# Patient Record
Sex: Female | Born: 1990 | Hispanic: Yes | Marital: Single | Smoking: Never smoker
Health system: Southern US, Community
[De-identification: ages and names within clinical notes are randomized; demographics above are authoritative.]

## PROBLEM LIST (undated history)

## (undated) ENCOUNTER — Inpatient Hospital Stay (HOSPITAL_COMMUNITY): Payer: Self-pay

## (undated) DIAGNOSIS — R87629 Unspecified abnormal cytological findings in specimens from vagina: Secondary | ICD-10-CM

## (undated) DIAGNOSIS — IMO0002 Reserved for concepts with insufficient information to code with codable children: Secondary | ICD-10-CM

## (undated) HISTORY — DX: Reserved for concepts with insufficient information to code with codable children: IMO0002

---
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
## 2012-06-13 NOTE — L&D Delivery Note (Signed)
Delivery Note At 11:13 AM a viable female was delivered via Vaginal, Spontaneous Delivery (Presentation: Left Occiput Anterior).  APGAR: 7, 9; weight 6 lb 2.8 oz (2800 g).   Placenta status: Intact, Spontaneous.  Cord: 3 vessels with the following complications: None.    Anesthesia: Epidural  Episiotomy: None Lacerations: None Suture Repair: n/a Est. Blood Loss (mL): 250  Mom to postpartum.  Baby to nursery-stable.  Jacquelin Hawking 03/07/2013, 4:19 PM Evaluation and management procedures were performed by Resident physician under my supervision/collaboration. Chart reviewed, patient examined by me and I agree with management and plan. Peds aware of fetal rt renal agenesis.

## 2012-06-13 NOTE — L&D Delivery Note (Signed)
Attestation of Attending Supervision of Advanced Practitioner (CNM/NP): Evaluation and management procedures were performed by the Advanced Practitioner under my supervision and collaboration.  I have reviewed the Advanced Practitioner's note and chart, and I agree with the management and plan.  HARRAWAY-SMITH, Chosen Geske 11:37 AM

## 2012-10-16 ENCOUNTER — Inpatient Hospital Stay (HOSPITAL_COMMUNITY): Payer: Medicaid Other

## 2012-10-16 ENCOUNTER — Inpatient Hospital Stay (HOSPITAL_COMMUNITY)
Admission: AD | Admit: 2012-10-16 | Discharge: 2012-10-16 | Disposition: A | Discharge status: Home / Self Care | Payer: Medicaid Other | Source: Ambulatory Visit | Attending: Obstetrics & Gynecology | Admitting: Obstetrics & Gynecology

## 2012-10-16 ENCOUNTER — Encounter (HOSPITAL_COMMUNITY): Payer: Self-pay | Admitting: *Deleted

## 2012-10-16 DIAGNOSIS — O0932 Supervision of pregnancy with insufficient antenatal care, second trimester: Secondary | ICD-10-CM

## 2012-10-16 DIAGNOSIS — R109 Unspecified abdominal pain: Secondary | ICD-10-CM | POA: Insufficient documentation

## 2012-10-16 DIAGNOSIS — N760 Acute vaginitis: Secondary | ICD-10-CM | POA: Insufficient documentation

## 2012-10-16 DIAGNOSIS — O2390 Unspecified genitourinary tract infection in pregnancy, unspecified trimester: Secondary | ICD-10-CM | POA: Insufficient documentation

## 2012-10-16 DIAGNOSIS — O4410 Placenta previa with hemorrhage, unspecified trimester: Secondary | ICD-10-CM | POA: Insufficient documentation

## 2012-10-16 DIAGNOSIS — O4690 Antepartum hemorrhage, unspecified, unspecified trimester: Secondary | ICD-10-CM

## 2012-10-16 DIAGNOSIS — O4692 Antepartum hemorrhage, unspecified, second trimester: Secondary | ICD-10-CM

## 2012-10-16 DIAGNOSIS — B9689 Other specified bacterial agents as the cause of diseases classified elsewhere: Secondary | ICD-10-CM | POA: Insufficient documentation

## 2012-10-16 DIAGNOSIS — A499 Bacterial infection, unspecified: Secondary | ICD-10-CM | POA: Insufficient documentation

## 2012-10-16 LAB — CBC
Hemoglobin: 11.8 g/dL — ABNORMAL LOW (ref 12.0–15.0)
MCV: 85.1 fL (ref 78.0–100.0)
Platelets: 185 10*3/uL (ref 150–400)
RBC: 4.03 MIL/uL (ref 3.87–5.11)
WBC: 11.4 10*3/uL — ABNORMAL HIGH (ref 4.0–10.5)

## 2012-10-16 LAB — OB RESULTS CONSOLE ABO/RH: RH Type: POSITIVE

## 2012-10-16 LAB — WET PREP, GENITAL: Yeast Wet Prep HPF POC: NONE SEEN

## 2012-10-16 LAB — DIFFERENTIAL
Lymphocytes Relative: 17 % (ref 12–46)
Lymphs Abs: 1.9 10*3/uL (ref 0.7–4.0)
Monocytes Relative: 10 % (ref 3–12)
Neutrophils Relative %: 74 % (ref 43–77)

## 2012-10-16 LAB — URINALYSIS, ROUTINE W REFLEX MICROSCOPIC
Bilirubin Urine: NEGATIVE
Nitrite: NEGATIVE
Specific Gravity, Urine: 1.015 (ref 1.005–1.030)
Urobilinogen, UA: 0.2 mg/dL (ref 0.0–1.0)
pH: 7 (ref 5.0–8.0)

## 2012-10-16 LAB — OB RESULTS CONSOLE GC/CHLAMYDIA
Chlamydia: NEGATIVE
Gonorrhea: NEGATIVE

## 2012-10-16 LAB — URINE MICROSCOPIC-ADD ON

## 2012-10-16 LAB — RAPID HIV SCREEN (WH-MAU): Rapid HIV Screen: NONREACTIVE

## 2012-10-16 LAB — TYPE AND SCREEN
ABO/RH(D): A POS
Antibody Screen: NEGATIVE

## 2012-10-16 MED ORDER — METRONIDAZOLE 500 MG PO TABS: 500.0000 mg | ORAL_TABLET | Freq: Two times a day (BID) | ORAL | Status: DC

## 2012-10-16 NOTE — MAU Provider Note (Signed)
History     CSN: [id number]  Arrival date and time: 10/16/12 4098   First Provider Initiated Contact with Patient 10/16/12 1953      Chief Complaint  Patient presents with  . Vaginal Bleeding   HPI Pt is G1P0 [redacted]w[redacted]d pregnant by LMP with no pregnantal care- awaiting Medicaid.  Pt presents with some bleeding since 6:30pm noticed when she wipes.  Pt also has some abdominal cramping on and off.  Pt denies UTI sx,constipation or diarrhea.  History reviewed. No pertinent past medical history.  Past Surgical History  Procedure Laterality Date  . Eye surgery      Family History  Problem Relation Age of Onset  . Diabetes Father   . Hyperlipidemia Father   . Diabetes Maternal Grandfather   . Diabetes Paternal Grandmother   . Diabetes Paternal Grandfather     History  Substance Use Topics  . Smoking status: Never Smoker   . Smokeless tobacco: Not on file  . Alcohol Use: No    Allergies: No Known Allergies  Prescriptions prior to admission  Medication Sig Dispense Refill  . Prenatal Vit-Fe Fumarate-FA (PRENATAL MULTIVITAMIN) TABS Take 1 tablet by mouth at bedtime.        Review of Systems  Constitutional: Negative for fever and chills.  Gastrointestinal: Positive for abdominal pain. Negative for nausea, vomiting, diarrhea and constipation.  Genitourinary: Negative for dysuria and urgency.   Physical Exam   Blood pressure 116/61, pulse 81, temperature 98.5 F (36.9 C), temperature source Oral, resp. rate 18, height 5\' 7"  (1.702 m), weight 66.225 kg (146 lb), last menstrual period 06/08/2012.  Physical Exam  Nursing note and vitals reviewed. Constitutional: She is oriented to person, place, and time. She appears well-developed and well-nourished.  HENT:  Head: Normocephalic.  Eyes: Pupils are equal, round, and reactive to light.  Neck: Normal range of motion. Neck supple.  Cardiovascular: Normal rate.   Respiratory: Effort normal.  GI: Soft. Bowel sounds are normal.   Genitourinary:  sm- mod amount of frothy red blood in vault; cervix closed, long, nontender; uterus gravid 16-18 week size. FHT audible with doppler.  Musculoskeletal: Normal range of motion.  Neurological: She is alert and oriented to person, place, and time.  Skin: Skin is warm and dry.  Psychiatric: She has a normal mood and affect.    MAU Course  Procedures Results for orders placed during the hospital encounter of 10/16/12 (from the past 24 hour(s))  URINALYSIS, ROUTINE W REFLEX MICROSCOPIC     Status: Abnormal   Collection Time    10/16/12  7:20 PM      Result Value Range   Color, Urine YELLOW  YELLOW   APPearance CLEAR  CLEAR   Specific Gravity, Urine 1.015  1.005 - 1.030   pH 7.0  5.0 - 8.0   Glucose, UA NEGATIVE  NEGATIVE mg/dL   Hgb urine dipstick MODERATE (*) NEGATIVE   Bilirubin Urine NEGATIVE  NEGATIVE   Ketones, ur NEGATIVE  NEGATIVE mg/dL   Protein, ur NEGATIVE  NEGATIVE mg/dL   Urobilinogen, UA 0.2  0.0 - 1.0 mg/dL   Nitrite NEGATIVE  NEGATIVE   Leukocytes, UA SMALL (*) NEGATIVE  URINE MICROSCOPIC-ADD ON     Status: Abnormal   Collection Time    10/16/12  7:20 PM      Result Value Range   Squamous Epithelial / LPF FEW (*) RARE   WBC, UA 3-6  <3 WBC/hpf   Bacteria, UA FEW (*) RARE  Urine-Other AMORPHOUS URATES/PHOSPHATES    WET PREP, GENITAL     Status: Abnormal   Collection Time    10/16/12  8:00 PM      Result Value Range   Yeast Wet Prep HPF POC NONE SEEN  NONE SEEN   Trich, Wet Prep NONE SEEN  NONE SEEN   Clue Cells Wet Prep HPF POC MODERATE (*) NONE SEEN   WBC, Wet Prep HPF POC MODERATE (*) NONE SEEN   US Ob Limited  10/16/2012  *RADIOLOGY REPORT*  Clinical Data: Bleeding.  Check placental and cervical  LIMITED OBSTETRIC ULTRASOUND  Number of Fetuses: 1 Heart Rate: 142 bpm Movement: Observed  Presentation: Transverse head right Placental Location: Posterior Previa: Marginal 1.2 cm from internal cervical os  Amniotic Fluid (Subjective): Normal   Vertical Pocket 33 cm     (5%ile = 90 cm; 95%ile = 207 cm)  BPD:  3.5 cm      16 w  six d       EDC:  03/27/2013  MATERNAL FINDINGS: Cervix: Closed and  measuring 3.4 cm Uterus/Adnexae:  Normal uterus and ovaries  IMPRESSION:  1. Cervix closed.  2.  Marginal previa 1.2 cm from internal cervical os. 3.  Normal fetal heart rate.  Recommend followup with non-emergent complete OB 14+ wk US examination for fetal biometric evaluation and anatomic survey if not already performed.   Original Report Authenticated By: Genevive Bi, M.D.    US Ob Transvaginal  10/16/2012  *RADIOLOGY REPORT*  Clinical Data: Bleeding.  Check placental and cervical  LIMITED OBSTETRIC ULTRASOUND  Number of Fetuses: 1 Heart Rate: 142 bpm Movement: Observed  Presentation: Transverse head right Placental Location: Posterior Previa: Marginal 1.2 cm from internal cervical os  Amniotic Fluid (Subjective): Normal  Vertical Pocket 33 cm     (5%ile = 90 cm; 95%ile = 207 cm)  BPD:  3.5 cm      16 w  six d       EDC:  03/27/2013  MATERNAL FINDINGS: Cervix: Closed and  measuring 3.4 cm Uterus/Adnexae:  Normal uterus and ovaries  IMPRESSION:  1. Cervix closed.  2.  Marginal previa 1.2 cm from internal cervical os. 3.  Normal fetal heart rate.  Recommend followup with non-emergent complete OB 14+ wk US examination for fetal biometric evaluation and anatomic survey if not already performed.   Original Report Authenticated By: Genevive Bi, M.D.   Discussed with Dr. Macon Large- will send pt home on pelvic rest and have pt seen for OB care in [facility] clinic  Prenatal labs drawn and pending at time of discharge Assessment and Plan  Bleeding in pregnancy- second trimester Marginal placenta previa Bacterial vaginosis- flagyl 500mg  BID for 7 days#14  Aiken Withem 10/16/2012, 9:10 PM

## 2012-10-16 NOTE — MAU Note (Signed)
Patient states she noticed some vaginal bleeding when she used the restroom around 630pm. Abdominal cramping

## 2012-10-17 LAB — URINE CULTURE: Colony Count: NO GROWTH

## 2012-10-17 LAB — HEPATITIS B SURFACE ANTIGEN: Hepatitis B Surface Ag: NEGATIVE

## 2012-10-17 LAB — SICKLE CELL SCREEN: Sickle Cell Screen: NEGATIVE

## 2012-10-17 LAB — RUBELLA SCREEN: Rubella: 5.96 Index — ABNORMAL HIGH (ref <0.90)

## 2012-10-17 LAB — GC/CHLAMYDIA PROBE AMP: GC Probe RNA: NEGATIVE

## 2012-10-17 LAB — ABO/RH: ABO/RH(D): A POS

## 2012-10-19 NOTE — MAU Provider Note (Signed)
Attestation of Attending Supervision of Advanced Practitioner (PA/CNM/NP): Evaluation and management procedures were performed by the Advanced Practitioner under my supervision and collaboration.  I have reviewed the Advanced Practitioner's note and chart, and I agree with the management and plan.  Sera Hitsman, MD, FACOG Attending Obstetrician & Gynecologist Faculty Practice, [facility]   

## 2012-11-13 ENCOUNTER — Inpatient Hospital Stay (HOSPITAL_COMMUNITY)
Admission: AD | Admit: 2012-11-13 | Discharge: 2012-11-13 | Disposition: A | Discharge status: Home / Self Care | Payer: Medicaid Other | Source: Ambulatory Visit | Attending: Obstetrics & Gynecology | Admitting: Obstetrics & Gynecology

## 2012-11-13 ENCOUNTER — Encounter (HOSPITAL_COMMUNITY): Payer: Self-pay

## 2012-11-13 DIAGNOSIS — O4410 Placenta previa with hemorrhage, unspecified trimester: Secondary | ICD-10-CM | POA: Insufficient documentation

## 2012-11-13 DIAGNOSIS — O209 Hemorrhage in early pregnancy, unspecified: Secondary | ICD-10-CM | POA: Insufficient documentation

## 2012-11-13 DIAGNOSIS — O4690 Antepartum hemorrhage, unspecified, unspecified trimester: Secondary | ICD-10-CM

## 2012-11-13 DIAGNOSIS — R109 Unspecified abdominal pain: Secondary | ICD-10-CM | POA: Insufficient documentation

## 2012-11-13 DIAGNOSIS — O4692 Antepartum hemorrhage, unspecified, second trimester: Secondary | ICD-10-CM

## 2012-11-13 LAB — URINALYSIS, ROUTINE W REFLEX MICROSCOPIC
Bilirubin Urine: NEGATIVE
Ketones, ur: NEGATIVE mg/dL
Protein, ur: NEGATIVE mg/dL
Urobilinogen, UA: 0.2 mg/dL (ref 0.0–1.0)

## 2012-11-13 NOTE — MAU Note (Signed)
Pt states she started noticing vaginal bleeding today around 5pm. Pt states she has gone to the bathroom x3 today and seen spotting  When she wiped

## 2012-11-13 NOTE — MAU Provider Note (Signed)
Chief Complaint: Vaginal Bleeding   First Provider Initiated Contact with Patient 11/13/12 1947     SUBJECTIVE HPI: Melanie Moore is a 22 y.o. G1P0 at [redacted]w[redacted]d by 16 wk scan (unsure LMP)  who presents with second episode of vaginal bleeding. Noted pink streak on toilert paper after wiping 2 hrs PTA. Seen here 10/16/12 for VB and had US showing "previa" 1.2 cm from cx. She did pelvic rest until 4 days ago had intercourse since she had no further spotting or bleeding after her initial visit. Had 2 mild cramps but no cramps or abdominal pain now. Good FM. No LOF.   Pregnancy course: [facility] but has appointment at [facility] 10/19/12. Took Flagyl for BV. Denies other pregnancy concerns, except on her feet long hours at work.   A+ blood type. PN labs WNL.  Past Medical History  Diagnosis Date  . Medical history non-contributory    OB History   Grav Para Term Preterm Abortions TAB SAB Ect Mult Living   1              # Outc Date GA Lbr Len/2nd Wgt Sex Del Anes PTL Lv   1 CUR              Past Surgical History  Procedure Laterality Date  . Eye surgery    . Eye surgery     History   Social History  . Marital Status: Single    Spouse Name: N/A    Number of Children: N/A  . Years of Education: N/A   Occupational History  . Not on file.   Social History Main Topics  . Smoking status: Never Smoker   . Smokeless tobacco: Not on file  . Alcohol Use: No  . Drug Use: No  . Sexually Active: Not Currently   Other Topics Concern  . Not on file   Social History Narrative  . No narrative on file   No current facility-administered medications on file prior to encounter.   Current Outpatient Prescriptions on File Prior to Encounter  Medication Sig Dispense Refill  . metroNIDAZOLE (FLAGYL) 500 MG tablet Take 1 tablet (500 mg total) by mouth 2 (two) times daily.  14 tablet  0  . Prenatal Vit-Fe Fumarate-FA (PRENATAL MULTIVITAMIN) TABS Take 1 tablet by mouth at bedtime.       No Known Allergies  ROS:  Pertinent items in HPI  OBJECTIVE Blood pressure 119/66, pulse 74, temperature 98.6 F (37 C), temperature source Oral, resp. rate 16, height 5\' 7"  (1.702 m), weight 67.359 kg (148 lb 8 oz), last menstrual period 06/08/2012. GENERAL: Well-developed, well-nourished female in no acute distress.  HEENT: Normocephalic HEART: normal rate RESP: normal effort ABDOMEN: Soft, non-tender DT FHR 160 EXTREMITIES: Nontender, no edema NEURO: Alert and oriented Gentle cx exam: post closed thick, somewhat soft cx. Scant pinkish-brown discharge.    LAB RESULTS Results for orders placed during the hospital encounter of 11/13/12 (from the past 24 hour(s))  URINALYSIS, ROUTINE W REFLEX MICROSCOPIC     Status: Abnormal   Collection Time    11/13/12  6:55 PM      Result Value Range   Color, Urine YELLOW  YELLOW   APPearance CLEAR  CLEAR   Specific Gravity, Urine 1.020  1.005 - 1.030   pH 7.0  5.0 - 8.0   Glucose, UA NEGATIVE  NEGATIVE mg/dL   Hgb urine dipstick SMALL (*) NEGATIVE   Bilirubin Urine NEGATIVE  NEGATIVE   Ketones, ur NEGATIVE  NEGATIVE mg/dL   Protein, ur NEGATIVE  NEGATIVE mg/dL   Urobilinogen, UA 0.2  0.0 - 1.0 mg/dL   Nitrite NEGATIVE  NEGATIVE   Leukocytes, UA TRACE (*) NEGATIVE  URINE MICROSCOPIC-ADD ON     Status: Abnormal   Collection Time    11/13/12  6:55 PM      Result Value Range   Squamous Epithelial / LPF RARE  RARE   WBC, UA 0-2  <3 WBC/hpf   RBC / HPF 3-6  <3 RBC/hpf   Bacteria, UA FEW (*) RARE    IMAGING US Ob Limited  10/16/2012   *RADIOLOGY REPORT*  Clinical Data: Bleeding.  Check placental and cervical  LIMITED OBSTETRIC ULTRASOUND  Number of Fetuses: 1 Heart Rate: 142 bpm Movement: Observed  Presentation: Transverse head right Placental Location: Posterior Previa: Marginal 1.2 cm from internal cervical os  Amniotic Fluid (Subjective): Normal  Vertical Pocket 33 cm     (5%ile = 90 cm; 95%ile = 207 cm)  BPD:  3.5 cm      16 w  six d       EDC:  03/27/2013  MATERNAL  FINDINGS: Cervix: Closed and  measuring 3.4 cm Uterus/Adnexae:  Normal uterus and ovaries  IMPRESSION:  1. Cervix closed.  2.  Marginal previa 1.2 cm from internal cervical os. 3.  Normal fetal heart rate.  Recommend followup with non-emergent complete OB 14+ wk US examination for fetal biometric evaluation and anatomic survey if not already performed.   Original Report Authenticated By: Genevive Bi, M.D.   US Ob Transvaginal  10/16/2012   *RADIOLOGY REPORT*  Clinical Data: Bleeding.  Check placental and cervical  LIMITED OBSTETRIC ULTRASOUND  Number of Fetuses: 1 Heart Rate: 142 bpm Movement: Observed  Presentation: Transverse head right Placental Location: Posterior Previa: Marginal 1.2 cm from internal cervical os  Amniotic Fluid (Subjective): Normal  Vertical Pocket 33 cm     (5%ile = 90 cm; 95%ile = 207 cm)  BPD:  3.5 cm      16 w  six d       EDC:  03/27/2013  MATERNAL FINDINGS: Cervix: Closed and  measuring 3.4 cm Uterus/Adnexae:  Normal uterus and ovaries  IMPRESSION:  1. Cervix closed.  2.  Marginal previa 1.2 cm from internal cervical os. 3.  Normal fetal heart rate.  Recommend followup with non-emergent complete OB 14+ wk US examination for fetal biometric evaluation and anatomic survey if not already performed.   Original Report Authenticated By: Genevive Bi, M.D.      ASSESSMENT 1. Vaginal bleeding in pregnancy, second trimester   2. Marginal placenta previa, second trimester   G1 at [redacted]w[redacted]d  PLAN Discharge home Follow-up Information   Follow up with [facility]. (Keep yuor appointment for prenatal care on Mon. Someone from ultrasound department will call you with an ultrasound appoointmnet this week. )    Contact information:   [phone]       Medication List    TAKE these medications       metroNIDAZOLE 500 MG tablet  Commonly known as:  FLAGYL  Take 1 tablet (500 mg total) by mouth 2 (two) times daily.     prenatal multivitamin Tabs  Take 1 tablet by mouth  at bedtime.       Follow-up Information   Follow up with [facility]. (Keep yuor appointment for prenatal care on Mon. Someone from ultrasound department will call you with an ultrasound appoointmnet this week. )  Contact information:   [phone]      Danae Orleans, CNM 11/13/2012  8:23 PM

## 2012-11-13 NOTE — MAU Note (Signed)
Pt states here for vaginal bleeding noted when in the restroom. Here prior and dx'd with marginal placenta previa. Last intercourse 4 days ago. Denies abnormal vaginal discharge besides blood. No pain

## 2012-11-15 ENCOUNTER — Other Ambulatory Visit (HOSPITAL_COMMUNITY): Payer: Self-pay

## 2012-11-16 ENCOUNTER — Ambulatory Visit (HOSPITAL_COMMUNITY)
Admission: RE | Admit: 2012-11-16 | Discharge: 2012-11-16 | Disposition: A | Discharge status: Home / Self Care | Payer: Medicaid Other | Source: Ambulatory Visit | Attending: Obstetrics and Gynecology | Admitting: Obstetrics and Gynecology

## 2012-11-16 DIAGNOSIS — Z3689 Encounter for other specified antenatal screening: Secondary | ICD-10-CM | POA: Insufficient documentation

## 2012-11-16 DIAGNOSIS — O4692 Antepartum hemorrhage, unspecified, second trimester: Secondary | ICD-10-CM

## 2012-11-19 ENCOUNTER — Ambulatory Visit (INDEPENDENT_AMBULATORY_CARE_PROVIDER_SITE_OTHER): Payer: Self-pay | Admitting: Obstetrics & Gynecology

## 2012-11-19 ENCOUNTER — Encounter: Payer: Self-pay | Admitting: Obstetrics & Gynecology

## 2012-11-19 VITALS — BP 109/70 | Temp 97.5°F | Wt 145.6 lb

## 2012-11-19 DIAGNOSIS — O0932 Supervision of pregnancy with insufficient antenatal care, second trimester: Secondary | ICD-10-CM

## 2012-11-19 DIAGNOSIS — O358XX1 Maternal care for other (suspected) fetal abnormality and damage, fetus 1: Secondary | ICD-10-CM

## 2012-11-19 DIAGNOSIS — O0930 Supervision of pregnancy with insufficient antenatal care, unspecified trimester: Secondary | ICD-10-CM | POA: Insufficient documentation

## 2012-11-19 DIAGNOSIS — IMO0002 Reserved for concepts with insufficient information to code with codable children: Secondary | ICD-10-CM

## 2012-11-19 DIAGNOSIS — O358XX0 Maternal care for other (suspected) fetal abnormality and damage, not applicable or unspecified: Secondary | ICD-10-CM

## 2012-11-19 HISTORY — DX: Reserved for concepts with insufficient information to code with codable children: IMO0002

## 2012-11-19 LAB — POCT URINALYSIS DIP (DEVICE)
Bilirubin Urine: NEGATIVE
Glucose, UA: NEGATIVE mg/dL
Ketones, ur: NEGATIVE mg/dL
Nitrite: NEGATIVE
Specific Gravity, Urine: 1.02 (ref 1.005–1.030)

## 2012-11-19 NOTE — Progress Notes (Signed)
P=104, Her for initial prenatal visit, has had 2 MAU visits for vaginal bleeding during this pregnancy. Given new pregnancy information and discussed bmi

## 2012-11-19 NOTE — Progress Notes (Signed)
Nutrition Note:  1st visit Wt. Gain slightly greater than expected.  Diet appears adequate.  PNV daily.  NO N/V Plans to BF Certified for [facility] today. Discussed wt. Gain goals of 25-35# F/U if referred. Candice C. Earlene Plater, MPH, RD, LDN

## 2012-11-19 NOTE — Patient Instructions (Addendum)
Return to clinic for any obstetric concerns or go to MAU for evaluation  

## 2012-11-19 NOTE — Progress Notes (Signed)
Harmony Test with Genetic Counseling scheduled 11/1312 at 1 pm in MFM.

## 2012-11-19 NOTE — Progress Notes (Signed)
  Subjective:    Melanie Moore is a G1P0 [redacted]w[redacted]d being seen today for her first obstetrical visit.  Her obstetrical history is significant for fetal right renal agenesis diagnosed on anatomy scan.. Patient does intend to breast feed. Pregnancy history fully reviewed.  Patient reports no complaints.  Filed Vitals:   11/19/12 0940  BP: 109/70  Temp: 97.5 F (36.4 C)  Weight: 145 lb 9.6 oz (66.044 kg)    HISTORY: OB History   Grav Para Term Preterm Abortions TAB SAB Ect Mult Living   1              # Outc Date GA Lbr Len/2nd Wgt Sex Del Anes PTL Lv   1 CUR              Past Medical History  Diagnosis Date  . Medical history non-contributory    Past Surgical History  Procedure Laterality Date  . Eye surgery  2008    sty removed both eyes   Family History  Problem Relation Age of Onset  . Diabetes Father   . Hyperlipidemia Father   . Diabetes Maternal Grandfather   . Diabetes Paternal Grandmother   . Diabetes Paternal Grandfather   . Depression Mother   . Fibromyalgia Mother      Exam    Uterus:  Fundal Height: 21 cm  Pelvic Exam:    Perineum: No Hemorrhoids, Normal Perineum   Vulva: normal   Vagina:  normal mucosa, normal discharge   Cervix: small amount of bleeding following pap smear   Adnexa: normal adnexa and no mass, fullness, tenderness   Bony Pelvis: gynecoid  System: Breast:  normal appearance, no masses or tenderness   Skin: normal coloration and turgor, no rashes   Neurologic: oriented, normal   Extremities: normal strength, tone, and muscle mass   HEENT PERRLA   Mouth/Teeth mucous membranes moist, pharynx normal without lesions   Neck supple and no masses   Cardiovascular: regular rate and rhythm   Respiratory:  appears well, vitals normal, no respiratory distress, acyanotic, normal RR   Abdomen: soft, non-tender; bowel sounds normal; no masses,  no organomegaly   Urinary: urethral meatus normal      Assessment:    Pregnancy: G1P0 Patient  Active Problem List   Diagnosis Date Noted  . Late prenatal care 11/19/2012  . 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 Continue prenatal vitamins. Problem list reviewed and updated. Discussed fetal renal agenesis with Dr. Claudean Severance (MFM); recommends follow up scan at 28 weeks.  Patient interested in [place] testing, referral to MFM made for the test and followup scan. [facility] notified.  Follow up in 4 weeks.    Margaretha Mahan A 11/19/2012

## 2012-11-21 LAB — CULTURE, OB URINE: Colony Count: 15000

## 2012-11-22 ENCOUNTER — Encounter: Payer: Self-pay | Admitting: Obstetrics & Gynecology

## 2012-11-22 ENCOUNTER — Encounter (HOSPITAL_COMMUNITY): Payer: Self-pay

## 2012-11-22 ENCOUNTER — Ambulatory Visit (HOSPITAL_COMMUNITY): Payer: Self-pay

## 2012-11-23 ENCOUNTER — Ambulatory Visit (HOSPITAL_COMMUNITY)
Admission: RE | Admit: 2012-11-23 | Discharge: 2012-11-23 | Disposition: A | Discharge status: Home / Self Care | Payer: Self-pay | Source: Ambulatory Visit | Attending: Obstetrics & Gynecology | Admitting: Obstetrics & Gynecology

## 2012-11-23 ENCOUNTER — Ambulatory Visit (HOSPITAL_COMMUNITY): Admission: RE | Admit: 2012-11-23 | Payer: Self-pay | Source: Ambulatory Visit

## 2012-11-27 NOTE — Progress Notes (Signed)
Genetic Counseling  High-Risk Gestation Note  Appointment Date:  11/23/2012 Referred By: Tereso Newcomer, MD Date of Birth:  1990-07-15    Pregnancy History: G1P0 Estimated Date of Delivery: 03/27/13 Estimated Gestational Age: [redacted]w[redacted]d Attending: Alpha Gula, MD   I met with Ms. Melanie Moore for genetic counseling because of the previous ultrasound finding of unilateral renal agenesis.  We began by reviewing the ultrasound in detail. Ultrasound was performed through Radiology at [facility] [place] on 11/16/12. Right renal agenesis was suspected at that time. Normal amniotic fluid volume was visualized. An ultrasound was not performed at the time of today's visit.   We discussed that renal agenesis can be unilateral or bilateral. Unilateral renal agenesis is estimated to occur in 1 in 1000 births. We discussed that unilateral renal agenesis may be asymptomatic. In cases of unilateral renal agenesis, other renal abnormalities are reported in approximately 50% of cases. Compensatory hypertrophy of the contralateral kidney is reported.   Renal agenesis is typically sporadic. However, there are familial cases reported that are consistent with autosomal dominant inheritance. Ms. Melanie Moore reported no known family history of congenital kidney abnormalities for herself or the father of the pregnancy. Additionally, prenatal exposures such as warfarin, cocaine, and maternal diabetes have been reported to be associated with renal agenesis. Ms. Melanie Moore reported no known exposures during pregnancy that would be expected to increase the chance for birth defects.  We also discussed that renal agenesis is described as an underlying feature of many single gene conditions and can also be seen with chromosome conditions. Specifically, renal agenesis has been described as a feature of two birth defects associations: VACTERL and MURCS, but no additional features suggestive of these associations were  reportedly observed on the patient's previous ultrasound. However, she was counseled that there were no other features reportedly observed by ultrasound that were strongly suggestive of a specific single gene condition or additional features suggestive of a chromosome condition at this time.  We discussed gene, chromosomes, and the chance for nondisjunction associated with age of the ova. We reviewed additional available screening and diagnostic option for chromosome conditions.  Regarding screening tests, we discussed the option of noninvasive prenatal screening (NIPS)/cell free fetal DNA testing.  She understands that screening tests are used to modify a patient's a priori risk for aneuploidy, typically based on age.  This estimate provides a pregnancy specific risk assessment. We reviewed the risks, benefits, and limitations of NIPS and reviewed the conditions screened, detection rates, and false positive rates for each. We also reviewed the availability of diagnostic option of amniocentesis. We discussed that associated 1 in 300-500 risk for complications with amniocentesis, including spontaneous pregnancy loss.  After careful consideration of these options, Ms. Melanie Moore declined NIPS and amniocentesis at this time, given that there were not additional ultrasound findings suggestive of fetal aneuploidy.  She understands that ultrasound cannot rule out all birth defects or genetic syndromes. The patient was advised of this limitation and states she does not want additional screening or testing at this time. Follow-up ultrasound was scheduled for 01/04/13 at the [facility] office.    We discussed that in the case of isolated renal agenesis, recurrence risk is likely low, given that sporadic occurrence is observed in the majority of cases. However, given that autosomal dominant inheritance has been observed in families, renal imaging would be available to Ms. Melanie Moore and the father of  the pregnancy in order to more accurately determine recurrence risk assessment  for relatives. In the case of an underlying autosomal dominant trait, recurrence risk would be 1 in 2 (50%). In the less likely case of an underlying genetic syndrome as the cause, recurrence risk would depend upon the inheritance of the particular condition.  Both family histories were reviewed and found to be contributory for intellectual disability for the patient's female maternal second cousin. The patient reported that an underlying cause for her mental delays is not known. She reportedly does not have physical differences from relatives and is otherwise healthy. No additional relatives were reported with intellectual disability, including this relative's siblings.  Ms. Scholes was counseled that there are many different causes of intellectual disabilities including environmental, multifactorial, and genetic etiologies.  We discussed that a specific diagnosis for intellectual disability can be determined in approximately 50% of these individuals.  In the remaining 50% of individuals, a diagnosis may never be determined.  Regarding genetic causes, we discussed that chromosome aberrations (aneuploidy, deletions, duplications, insertions, and translocations) are responsible for a small percentage of individuals with intellectual disability.  Many individuals with chromosome aberrations have additional differences, including congenital anomalies or minor dysmorphisms.  Likewise, single gene conditions are the underlying cause of intellectual delay in some families.  We discussed that many gene conditions have intellectual disability as a feature, but also often include other physical or medical differences.  We discussed the option of this family member having an evaluation by a medical geneticist to help determine the cause of the familial intellectual disability.  This relative currently resides in the [place].  Additionally, the patient reported that the father of the pregnancy has a paternal half-nephew (his paternal half-sister's son) with some delays with walking and talking at age 31 years. An underlying cause is currently not known, but he reportedly is in the process of being evaluated.  We discussed that without more specific information regarding these relatives, it is difficult to provide an accurate risk assessment.  Further genetic counseling is warranted if more information is obtained.  Ms. Laiya Wisby denied exposure to environmental toxins or chemical agents. She denied the use of alcohol, tobacco or street drugs. She denied significant viral illnesses during the course of her pregnancy. Her medical and surgical histories were noncontributory.   I counseled Ms. Carmaleta Youngers regarding the above risks and available options. The approximate face-to-face time with the genetic counselor was 40 minutes.  Quinn Plowman, MS Certified Genetic Counselor 11/27/2012

## 2012-12-17 ENCOUNTER — Ambulatory Visit (INDEPENDENT_AMBULATORY_CARE_PROVIDER_SITE_OTHER): Payer: Medicaid Other | Admitting: Family Medicine

## 2012-12-17 ENCOUNTER — Encounter: Payer: Self-pay | Admitting: Family Medicine

## 2012-12-17 VITALS — BP 99/63 | Temp 98.6°F | Wt 153.0 lb

## 2012-12-17 DIAGNOSIS — O358XX1 Maternal care for other (suspected) fetal abnormality and damage, fetus 1: Secondary | ICD-10-CM

## 2012-12-17 DIAGNOSIS — O358XX0 Maternal care for other (suspected) fetal abnormality and damage, not applicable or unspecified: Secondary | ICD-10-CM

## 2012-12-17 LAB — POCT URINALYSIS DIP (DEVICE)
Bilirubin Urine: NEGATIVE
Glucose, UA: NEGATIVE mg/dL
Hgb urine dipstick: NEGATIVE
Nitrite: NEGATIVE
Specific Gravity, Urine: 1.025 (ref 1.005–1.030)
Urobilinogen, UA: 0.2 mg/dL (ref 0.0–1.0)

## 2012-12-17 NOTE — Progress Notes (Signed)
U/s for f/u of right renal agenesis in MFM scheduled.

## 2012-12-17 NOTE — Patient Instructions (Addendum)
Pregnancy - Second Trimester The second trimester of pregnancy (3 to 6 months) is a period of rapid growth for you and your baby. At the end of the sixth month, your baby is about 9 inches long and weighs 1 1/2 pounds. You will begin to feel the baby move between 18 and 20 weeks of the pregnancy. This is called quickening. Weight gain is faster. A clear fluid (colostrum) may leak out of your breasts. You may feel small contractions of the womb (uterus). This is known as false labor or Braxton-Hicks contractions. This is like a practice for labor when the baby is ready to be born. Usually, the problems with morning sickness have usually passed by the end of your first trimester. Some women develop small dark blotches (called cholasma, mask of pregnancy) on their face that usually goes away after the baby is born. Exposure to the sun makes the blotches worse. Acne may also develop in some pregnant women and pregnant women who have acne, may find that it goes away. PRENATAL EXAMS  Blood work may continue to be done during prenatal exams. These tests are done to check on your health and the probable health of your baby. Blood work is used to follow your blood levels (hemoglobin). Anemia (low hemoglobin) is common during pregnancy. Iron and vitamins are given to help prevent this. You will also be checked for diabetes between 24 and 28 weeks of the pregnancy. Some of the previous blood tests may be repeated.  The size of the uterus is measured during each visit. This is to make sure that the baby is continuing to grow properly according to the dates of the pregnancy.  Your blood pressure is checked every prenatal visit. This is to make sure you are not getting toxemia.  Your urine is checked to make sure you do not have an infection, diabetes or protein in the urine.  Your weight is checked often to make sure gains are happening at the suggested rate. This is to ensure that both you and your baby are  growing normally.  Sometimes, an ultrasound is performed to confirm the proper growth and development of the baby. This is a test which bounces harmless sound waves off the baby so your caregiver can more accurately determine due dates. Sometimes, a test is done on the amniotic fluid surrounding the baby. This test is called an amniocentesis. The amniotic fluid is obtained by sticking a needle into the belly (abdomen). This is done to check the chromosomes in instances where there is a concern about possible genetic problems with the baby. It is also sometimes done near the end of pregnancy if an early delivery is required. In this case, it is done to help make sure the baby's lungs are mature enough for the baby to live outside of the womb. CHANGES OCCURING IN THE SECOND TRIMESTER OF PREGNANCY Your body goes through many changes during pregnancy. They vary from person to person. Talk to your caregiver about changes you notice that you are concerned about.  During the second trimester, you will likely have an increase in your appetite. It is normal to have cravings for certain foods. This varies from person to person and pregnancy to pregnancy.  Your lower abdomen will begin to bulge.  You may have to urinate more often because the uterus and baby are pressing on your bladder. It is also common to get more bladder infections during pregnancy. You can help this by drinking lots of fluids   and emptying your bladder before and after intercourse.  You may begin to get stretch marks on your hips, abdomen, and breasts. These are normal changes in the body during pregnancy. There are no exercises or medicines to take that prevent this change.  You may begin to develop swollen and bulging veins (varicose veins) in your legs. Wearing support hose, elevating your feet for 15 minutes, 3 to 4 times a day and limiting salt in your diet helps lessen the problem.  Heartburn may develop as the uterus grows and  pushes up against the stomach. Antacids recommended by your caregiver helps with this problem. Also, eating smaller meals 4 to 5 times a day helps.  Constipation can be treated with a stool softener or adding bulk to your diet. Drinking lots of fluids, and eating vegetables, fruits, and whole grains are helpful.  Exercising is also helpful. If you have been very active up until your pregnancy, most of these activities can be continued during your pregnancy. If you have been less active, it is helpful to start an exercise program such as walking.  Hemorrhoids may develop at the end of the second trimester. Warm sitz baths and hemorrhoid cream recommended by your caregiver helps hemorrhoid problems.  Backaches may develop during this time of your pregnancy. Avoid heavy lifting, wear low heal shoes, and practice good posture to help with backache problems.  Some pregnant women develop tingling and numbness of their hand and fingers because of swelling and tightening of ligaments in the wrist (carpel tunnel syndrome). This goes away after the baby is born.  As your breasts enlarge, you may have to get a bigger bra. Get a comfortable, cotton, support bra. Do not get a nursing bra until the last month of the pregnancy if you will be nursing the baby.  You may get a dark line from your belly button to the pubic area called the linea nigra.  You may develop rosy cheeks because of increase blood flow to the face.  You may develop spider looking lines of the face, neck, arms, and chest. These go away after the baby is born. HOME CARE INSTRUCTIONS   It is extremely important to avoid all smoking, herbs, alcohol, and unprescribed drugs during your pregnancy. These chemicals affect the formation and growth of the baby. Avoid these chemicals throughout the pregnancy to ensure the delivery of a healthy infant.  Most of your home care instructions are the same as suggested for the first trimester of your  pregnancy. Keep your caregiver's appointments. Follow your caregiver's instructions regarding medicine use, exercise, and diet.  During pregnancy, you are providing food for you and your baby. Continue to eat regular, well-balanced meals. Choose foods such as meat, fish, milk and other low fat dairy products, vegetables, fruits, and whole-grain breads and cereals. Your caregiver will tell you of the ideal weight gain.  A physical sexual relationship may be continued up until near the end of pregnancy if there are no other problems. Problems could include early (premature) leaking of amniotic fluid from the membranes, vaginal bleeding, abdominal pain, or other medical or pregnancy problems.  Exercise regularly if there are no restrictions. Check with your caregiver if you are unsure of the safety of some of your exercises. The greatest weight gain will occur in the last 2 trimesters of pregnancy. Exercise will help you:  Control your weight.  Get you in shape for labor and delivery.  Lose weight after you have the baby.  Wear   a good support or jogging bra for breast tenderness during pregnancy. This may help if worn during sleep. Pads or tissues may be used in the bra if you are leaking colostrum.  Do not use hot tubs, steam rooms or saunas throughout the pregnancy.  Wear your seat belt at all times when driving. This protects you and your baby if you are in an accident.  Avoid raw meat, uncooked cheese, cat litter boxes, and soil used by cats. These carry germs that can cause birth defects in the baby.  The second trimester is also a good time to visit your dentist for your dental health if this has not been done yet. Getting your teeth cleaned is okay. Use a soft toothbrush. Brush gently during pregnancy.  It is easier to leak urine during pregnancy. Tightening up and strengthening the pelvic muscles will help with this problem. Practice stopping your urination while you are going to the  bathroom. These are the same muscles you need to strengthen. It is also the muscles you would use as if you were trying to stop from passing gas. You can practice tightening these muscles up 10 times a set and repeating this about 3 times per day. Once you know what muscles to tighten up, do not perform these exercises during urination. It is more likely to contribute to an infection by backing up the urine.  Ask for help if you have financial, counseling, or nutritional needs during pregnancy. Your caregiver will be able to offer counseling for these needs as well as refer you for other special needs.  Your skin may become oily. If so, wash your face with mild soap, use non-greasy moisturizer and oil or cream based makeup. MEDICINES AND DRUG USE IN PREGNANCY  Take prenatal vitamins as directed. The vitamin should contain 1 milligram of folic acid. Keep all vitamins out of reach of children. Only a couple vitamins or tablets containing iron may be fatal to a baby or young child when ingested.  Avoid use of all medicines, including herbs, over-the-counter medicines, not prescribed or suggested by your caregiver. Only take over-the-counter or prescription medicines for pain, discomfort, or fever as directed by your caregiver. Do not use aspirin.  Let your caregiver also know about herbs you may be using.  Alcohol is related to a number of birth defects. This includes fetal alcohol syndrome. All alcohol, in any form, should be avoided completely. Smoking will cause low birth rate and premature babies.  Street or illegal drugs are very harmful to the baby. They are absolutely forbidden. A baby born to an addicted mother will be addicted at birth. The baby will go through the same withdrawal an adult does. SEEK MEDICAL CARE IF:  You have any concerns or worries during your pregnancy. It is better to call with your questions if you feel they cannot wait, rather than worry about them. SEEK IMMEDIATE  MEDICAL CARE IF:   An unexplained oral temperature above 102 F (38.9 C) develops, or as your caregiver suggests.  You have leaking of fluid from the vagina (birth canal). If leaking membranes are suspected, take your temperature and tell your caregiver of this when you call.  There is vaginal spotting, bleeding, or passing clots. Tell your caregiver of the amount and how many pads are used. Light spotting in pregnancy is common, especially following intercourse.  You develop a bad smelling vaginal discharge with a change in the color from clear to white.  You continue to feel   sick to your stomach (nauseated) and have no relief from remedies suggested. You vomit blood or coffee ground-like materials.  You lose more than 2 pounds of weight or gain more than 2 pounds of weight over 1 week, or as suggested by your caregiver.  You notice swelling of your face, hands, feet, or legs.  You get exposed to German measles and have never had them.  You are exposed to fifth disease or chickenpox.  You develop belly (abdominal) pain. Round ligament discomfort is a common non-cancerous (benign) cause of abdominal pain in pregnancy. Your caregiver still must evaluate you.  You develop a bad headache that does not go away.  You develop fever, diarrhea, pain with urination, or shortness of breath.  You develop visual problems, blurry, or double vision.  You fall or are in a car accident or any kind of trauma.  There is mental or physical violence at home. Document Released: 05/24/2001 Document Revised: 02/22/2012 Document Reviewed: 11/26/2008 [employer] Patient Information 2014 [employer].  Breastfeeding A change in hormones during your pregnancy causes growth of your breast tissue and an increase in number and size of milk ducts. The hormone prolactin allows proteins, sugars, and fats from your blood supply to make breast milk in your milk-producing glands. The hormone progesterone prevents  breast milk from being released before the birth of your baby. After the birth of your baby, your progesterone level decreases allowing breast milk to be released. Thoughts of your baby, as well as his or her sucking or crying, can stimulate the release of milk from the milk-producing glands. Deciding to breastfeed (nurse) is one of the best choices you can make for you and your baby. The information that follows gives a brief review of the benefits, as well as other important skills to know about breastfeeding. BENEFITS OF BREASTFEEDING For your baby  The first milk (colostrum) helps your baby's digestive system function better.   There are antibodies in your milk that help your baby fight off infections.   Your baby has a lower incidence of asthma, allergies, and sudden infant death syndrome (SIDS).   The nutrients in breast milk are better for your baby than infant formulas.  Breast milk improves your baby's brain development.   Your baby will have less gas, colic, and constipation.  Your baby is less likely to develop other conditions, such as childhood obesity, asthma, or diabetes mellitus. For you  Breastfeeding helps develop a very special bond between you and your baby.   Breastfeeding is convenient, always available at the correct temperature, and costs nothing.   Breastfeeding helps to burn calories and helps you lose the weight gained during pregnancy.   Breastfeeding makes your uterus contract back down to normal size faster and slows bleeding following delivery.   Breastfeeding mothers have a lower risk of developing osteoporosis or breast or ovarian cancer later in life.  BREASTFEEDING FREQUENCY  A healthy, full-term baby may breastfeed as often as every hour or space his or her feedings to every 3 hours. Breastfeeding frequency will vary from baby to baby.   Newborns should be fed no less than every 2 3 hours during the day and every 4 5 hours during the  night. You should breastfeed a minimum of 8 feedings in a 24 hour period.  Awaken your baby to breastfeed if it has been 3 4 hours since the last feeding.  Breastfeed when you feel the need to reduce the fullness of your breasts or when   your newborn shows signs of hunger. Signs that your baby may be hungry include:  Increased alertness or activity.  Stretching.  Movement of the head from side to side.  Movement of the head and opening of the mouth when the corner of the mouth or cheek is stroked (rooting).  Increased sucking sounds, smacking lips, cooing, sighing, or squeaking.  Hand-to-mouth movements.  Increased sucking of fingers or hands.  Fussing.  Intermittent crying.  Signs of extreme hunger will require calming and consoling before you try to feed your baby. Signs of extreme hunger may include:  Restlessness.  A loud, strong cry.  Screaming.  Frequent feeding will help you make more milk and will help prevent problems, such as sore nipples and engorgement of the breasts.  BREASTFEEDING   Whether lying down or sitting, be sure that the baby's abdomen is facing your abdomen.   Support your breast with 4 fingers under your breast and your thumb above your nipple. Make sure your fingers are well away from your nipple and your baby's mouth.   Stroke your baby's lips gently with your finger or nipple.   When your baby's mouth is open wide enough, place all of your nipple and as much of the colored area around your nipple (areola) as possible into your baby's mouth.  More areola should be visible above his or her upper lip than below his or her lower lip.  Your baby's tongue should be between his or her lower gum and your breast.  Ensure that your baby's mouth is correctly positioned around the nipple (latched). Your baby's lips should create a seal on your breast.  Signs that your baby has effectively latched onto your nipple include:  Tugging or sucking  without pain.  Swallowing heard between sucks.  Absent click or smacking sound.  Muscle movement above and in front of his or her ears with sucking.  Your baby must suck about 2 3 minutes in order to get your milk. Allow your baby to feed on each breast as long as he or she wants. Nurse your baby until he or she unlatches or falls asleep at the first breast, then offer the second breast.  Signs that your baby is full and satisfied include:  A gradual decrease in the number of sucks or complete cessation of sucking.  Falling asleep.  Extension or relaxation of his or her body.  Retention of a small amount of milk in his or her mouth.  Letting go of your breast by himself or herself.  Signs of effective breastfeeding in you include:  Breasts that have increased firmness, weight, and size prior to feeding.  Breasts that are softer after nursing.  Increased milk volume, as well as a change in milk consistency and color by the 5th day of breastfeeding.  Breast fullness relieved by breastfeeding.  Nipples are not sore, cracked, or bleeding.  If needed, break the suction by putting your finger into the corner of your baby's mouth and sliding your finger between his or her gums. Then, remove your breast from his or her mouth.  It is common for babies to spit up a small amount after a feeding.  Babies often swallow air during feeding. This can make babies fussy. Burping your baby between breasts can help with this.  Vitamin D supplements are recommended for babies who get only breast milk.  Avoid using a pacifier during your baby's first 4 6 weeks.  Avoid supplemental feedings of water, formula, or   juice in place of breastfeeding. Breast milk is all the food your baby needs. It is not necessary for your baby to have water or formula. Your breasts will make more milk if supplemental feedings are avoided during the early weeks. HOW TO TELL WHETHER YOUR BABY IS GETTING ENOUGH BREAST  MILK Wondering whether or not your baby is getting enough milk is a common concern among mothers. You can be assured that your baby is getting enough milk if:   Your baby is actively sucking and you hear swallowing.   Your baby seems relaxed and satisfied after a feeding.   Your baby nurses at least 8 12 times in a 24 hour time period.  During the first 3 5 days of age:  Your baby is wetting at least 3 5 diapers in a 24 hour period. The urine should be clear and pale yellow.  Your baby is having at least 3 4 stools in a 24 hour period. The stool should be soft and yellow.  At 5 7 days of age, your baby is having at least 3 6 stools in a 24 hour period. The stool should be seedy and yellow by 5 days of age.  Your baby has a weight loss less than 7 10% during the first 3 days of age.  Your baby does not lose weight after 3 7 days of age.  Your baby gains 4 7 ounces each week after he or she is 4 days of age.  Your baby gains weight by 5 days of age and is back to birth weight within 2 weeks. ENGORGEMENT In the first week after your baby is born, you may experience extremely full breasts (engorgement). When engorged, your breasts may feel heavy, warm, or tender to the touch. Engorgement peaks within 24 48 hours after delivery of your baby.  Engorgement may be reduced by:  Continuing to breastfeed.  Increasing the frequency of breastfeeding.  Taking warm showers or applying warm, moist heat to your breasts just before each feeding. This increases circulation and helps the milk flow.   Gently massaging your breast before and during the feedings. With your fingertips, massage from your chest wall towards your nipple in a circular motion.   Ensuring that your baby empties at least one breast at every feeding. It also helps to start the next feeding on the opposite breast.   Expressing breast milk by hand or by using a breast pump to empty the breasts if your baby is sleepy, or  not nursing well. You may also want to express milk if you are returning to work oryou feel you are getting engorged.  Ensuring your baby is latched on and positioned properly while breastfeeding. If you follow these suggestions, your engorgement should improve in 24 48 hours. If you are still experiencing difficulty, call your lactation consultant or caregiver.  CARING FOR YOURSELF Take care of your breasts.  Bathe or shower daily.   Avoid using soap on your nipples.   Wear a supportive bra. Avoid wearing underwire style bras.  Air dry your nipples for a 3 4minutes after each feeding.   Use only cotton bra pads to absorb breast milk leakage. Leaking of breast milk between feedings is normal.   Use only pure lanolin on your nipples after nursing. You do not need to wash it off before feeding your baby again. Another option is to express a few drops of breast milk and gently massage that milk into your nipples.  Continue   breast self-awareness checks. Take care of yourself.  Eat healthy foods. Alternate 3 meals with 3 snacks.  Avoid foods that you notice affect your baby in a bad way.  Drink milk, fruit juice, and water to satisfy your thirst (about 8 glasses a day).   Rest often, relax, and take your prenatal vitamins to prevent fatigue, stress, and anemia.  Avoid chewing and smoking tobacco.  Avoid alcohol and drug use.  Take over-the-counter and prescribed medicine only as directed by your caregiver or pharmacist. You should always check with your caregiver or pharmacist before taking any new medicine, vitamin, or herbal supplement.  Know that pregnancy is possible while breastfeeding. If desired, talk to your caregiver about family planning and safe birth control methods that may be used while breastfeeding. SEEK MEDICAL CARE IF:   You feel like you want to stop breastfeeding or have become frustrated with breastfeeding.  You have painful breasts or nipples.  Your  nipples are cracked or bleeding.  Your breasts are red, tender, or warm.  You have a swollen area on either breast.  You have a fever or chills.  You have nausea or vomiting.  You have drainage from your nipples.  Your breasts do not become full before feedings by the 5th day after delivery.  You feel sad and depressed.  Your baby is too sleepy to eat well.  Your baby is having trouble sleeping.   Your baby is wetting less than 3 diapers in a 24 hour period.  Your baby has less than 3 stools in a 24 hour period.  Your baby's skin or the white part of his or her eyes becomes more yellow.   Your baby is not gaining weight by 5 days of age. MAKE SURE YOU:   Understand these instructions.  Will watch your condition.  Will get help right away if you are not doing well or get worse. Document Released: 05/30/2005 Document Revised: 02/22/2012 Document Reviewed: 01/04/2012 [employer] Patient Information 2014 [employer].  

## 2012-12-17 NOTE — Progress Notes (Signed)
Pulse 94  Edema trace in hands and feet.

## 2012-12-31 ENCOUNTER — Ambulatory Visit (INDEPENDENT_AMBULATORY_CARE_PROVIDER_SITE_OTHER): Payer: Medicaid Other | Admitting: Family Medicine

## 2012-12-31 VITALS — BP 104/67 | Temp 99.0°F | Wt 156.0 lb

## 2012-12-31 DIAGNOSIS — O358XX0 Maternal care for other (suspected) fetal abnormality and damage, not applicable or unspecified: Secondary | ICD-10-CM

## 2012-12-31 DIAGNOSIS — Z3492 Encounter for supervision of normal pregnancy, unspecified, second trimester: Secondary | ICD-10-CM

## 2012-12-31 DIAGNOSIS — O358XX1 Maternal care for other (suspected) fetal abnormality and damage, fetus 1: Secondary | ICD-10-CM

## 2012-12-31 LAB — POCT URINALYSIS DIP (DEVICE)
Protein, ur: NEGATIVE mg/dL
Specific Gravity, Urine: 1.02 (ref 1.005–1.030)
Urobilinogen, UA: 0.2 mg/dL (ref 0.0–1.0)
pH: 7 (ref 5.0–8.0)

## 2012-12-31 LAB — CBC
HCT: 37.8 % (ref 36.0–46.0)
Hemoglobin: 12.8 g/dL (ref 12.0–15.0)
MCH: 29.6 pg (ref 26.0–34.0)
MCHC: 33.9 g/dL (ref 30.0–36.0)
MCV: 87.5 fL (ref 78.0–100.0)

## 2012-12-31 NOTE — Addendum Note (Signed)
Addended by: Franchot Mimes on: 12/31/2012 10:18 AM   Modules accepted: Orders

## 2012-12-31 NOTE — Addendum Note (Signed)
Addended by: Franchot Mimes on: 12/31/2012 11:03 AM   Modules accepted: Orders

## 2012-12-31 NOTE — Patient Instructions (Addendum)
Pregnancy - Second Trimester The second trimester of pregnancy (3 to 6 months) is a period of rapid growth for you and your baby. At the end of the sixth month, your baby is about 9 inches long and weighs 1 1/2 pounds. You will begin to feel the baby move between 18 and 20 weeks of the pregnancy. This is called quickening. Weight gain is faster. A clear fluid (colostrum) may leak out of your breasts. You may feel small contractions of the womb (uterus). This is known as false labor or Braxton-Hicks contractions. This is like a practice for labor when the baby is ready to be born. Usually, the problems with morning sickness have usually passed by the end of your first trimester. Some women develop small dark blotches (called cholasma, mask of pregnancy) on their face that usually goes away after the baby is born. Exposure to the sun makes the blotches worse. Acne may also develop in some pregnant women and pregnant women who have acne, may find that it goes away. PRENATAL EXAMS  Blood work may continue to be done during prenatal exams. These tests are done to check on your health and the probable health of your baby. Blood work is used to follow your blood levels (hemoglobin). Anemia (low hemoglobin) is common during pregnancy. Iron and vitamins are given to help prevent this. You will also be checked for diabetes between 24 and 28 weeks of the pregnancy. Some of the previous blood tests may be repeated.  The size of the uterus is measured during each visit. This is to make sure that the baby is continuing to grow properly according to the dates of the pregnancy.  Your blood pressure is checked every prenatal visit. This is to make sure you are not getting toxemia.  Your urine is checked to make sure you do not have an infection, diabetes or protein in the urine.  Your weight is checked often to make sure gains are happening at the suggested rate. This is to ensure that both you and your baby are  growing normally.  Sometimes, an ultrasound is performed to confirm the proper growth and development of the baby. This is a test which bounces harmless sound waves off the baby so your caregiver can more accurately determine due dates. Sometimes, a test is done on the amniotic fluid surrounding the baby. This test is called an amniocentesis. The amniotic fluid is obtained by sticking a needle into the belly (abdomen). This is done to check the chromosomes in instances where there is a concern about possible genetic problems with the baby. It is also sometimes done near the end of pregnancy if an early delivery is required. In this case, it is done to help make sure the baby's lungs are mature enough for the baby to live outside of the womb. CHANGES OCCURING IN THE SECOND TRIMESTER OF PREGNANCY Your body goes through many changes during pregnancy. They vary from person to person. Talk to your caregiver about changes you notice that you are concerned about.  During the second trimester, you will likely have an increase in your appetite. It is normal to have cravings for certain foods. This varies from person to person and pregnancy to pregnancy.  Your lower abdomen will begin to bulge.  You may have to urinate more often because the uterus and baby are pressing on your bladder. It is also common to get more bladder infections during pregnancy. You can help this by drinking lots of fluids   and emptying your bladder before and after intercourse.  You may begin to get stretch marks on your hips, abdomen, and breasts. These are normal changes in the body during pregnancy. There are no exercises or medicines to take that prevent this change.  You may begin to develop swollen and bulging veins (varicose veins) in your legs. Wearing support hose, elevating your feet for 15 minutes, 3 to 4 times a day and limiting salt in your diet helps lessen the problem.  Heartburn may develop as the uterus grows and  pushes up against the stomach. Antacids recommended by your caregiver helps with this problem. Also, eating smaller meals 4 to 5 times a day helps.  Constipation can be treated with a stool softener or adding bulk to your diet. Drinking lots of fluids, and eating vegetables, fruits, and whole grains are helpful.  Exercising is also helpful. If you have been very active up until your pregnancy, most of these activities can be continued during your pregnancy. If you have been less active, it is helpful to start an exercise program such as walking.  Hemorrhoids may develop at the end of the second trimester. Warm sitz baths and hemorrhoid cream recommended by your caregiver helps hemorrhoid problems.  Backaches may develop during this time of your pregnancy. Avoid heavy lifting, wear low heal shoes, and practice good posture to help with backache problems.  Some pregnant women develop tingling and numbness of their hand and fingers because of swelling and tightening of ligaments in the wrist (carpel tunnel syndrome). This goes away after the baby is born.  As your breasts enlarge, you may have to get a bigger bra. Get a comfortable, cotton, support bra. Do not get a nursing bra until the last month of the pregnancy if you will be nursing the baby.  You may get a dark line from your belly button to the pubic area called the linea nigra.  You may develop rosy cheeks because of increase blood flow to the face.  You may develop spider looking lines of the face, neck, arms, and chest. These go away after the baby is born. HOME CARE INSTRUCTIONS   It is extremely important to avoid all smoking, herbs, alcohol, and unprescribed drugs during your pregnancy. These chemicals affect the formation and growth of the baby. Avoid these chemicals throughout the pregnancy to ensure the delivery of a healthy infant.  Most of your home care instructions are the same as suggested for the first trimester of your  pregnancy. Keep your caregiver's appointments. Follow your caregiver's instructions regarding medicine use, exercise, and diet.  During pregnancy, you are providing food for you and your baby. Continue to eat regular, well-balanced meals. Choose foods such as meat, fish, milk and other low fat dairy products, vegetables, fruits, and whole-grain breads and cereals. Your caregiver will tell you of the ideal weight gain.  A physical sexual relationship may be continued up until near the end of pregnancy if there are no other problems. Problems could include early (premature) leaking of amniotic fluid from the membranes, vaginal bleeding, abdominal pain, or other medical or pregnancy problems.  Exercise regularly if there are no restrictions. Check with your caregiver if you are unsure of the safety of some of your exercises. The greatest weight gain will occur in the last 2 trimesters of pregnancy. Exercise will help you:  Control your weight.  Get you in shape for labor and delivery.  Lose weight after you have the baby.  Wear   a good support or jogging bra for breast tenderness during pregnancy. This may help if worn during sleep. Pads or tissues may be used in the bra if you are leaking colostrum.  Do not use hot tubs, steam rooms or saunas throughout the pregnancy.  Wear your seat belt at all times when driving. This protects you and your baby if you are in an accident.  Avoid raw meat, uncooked cheese, cat litter boxes, and soil used by cats. These carry germs that can cause birth defects in the baby.  The second trimester is also a good time to visit your dentist for your dental health if this has not been done yet. Getting your teeth cleaned is okay. Use a soft toothbrush. Brush gently during pregnancy.  It is easier to leak urine during pregnancy. Tightening up and strengthening the pelvic muscles will help with this problem. Practice stopping your urination while you are going to the  bathroom. These are the same muscles you need to strengthen. It is also the muscles you would use as if you were trying to stop from passing gas. You can practice tightening these muscles up 10 times a set and repeating this about 3 times per day. Once you know what muscles to tighten up, do not perform these exercises during urination. It is more likely to contribute to an infection by backing up the urine.  Ask for help if you have financial, counseling, or nutritional needs during pregnancy. Your caregiver will be able to offer counseling for these needs as well as refer you for other special needs.  Your skin may become oily. If so, wash your face with mild soap, use non-greasy moisturizer and oil or cream based makeup. MEDICINES AND DRUG USE IN PREGNANCY  Take prenatal vitamins as directed. The vitamin should contain 1 milligram of folic acid. Keep all vitamins out of reach of children. Only a couple vitamins or tablets containing iron may be fatal to a baby or young child when ingested.  Avoid use of all medicines, including herbs, over-the-counter medicines, not prescribed or suggested by your caregiver. Only take over-the-counter or prescription medicines for pain, discomfort, or fever as directed by your caregiver. Do not use aspirin.  Let your caregiver also know about herbs you may be using.  Alcohol is related to a number of birth defects. This includes fetal alcohol syndrome. All alcohol, in any form, should be avoided completely. Smoking will cause low birth rate and premature babies.  Street or illegal drugs are very harmful to the baby. They are absolutely forbidden. A baby born to an addicted mother will be addicted at birth. The baby will go through the same withdrawal an adult does. SEEK MEDICAL CARE IF:  You have any concerns or worries during your pregnancy. It is better to call with your questions if you feel they cannot wait, rather than worry about them. SEEK IMMEDIATE  MEDICAL CARE IF:   An unexplained oral temperature above 102 F (38.9 C) develops, or as your caregiver suggests.  You have leaking of fluid from the vagina (birth canal). If leaking membranes are suspected, take your temperature and tell your caregiver of this when you call.  There is vaginal spotting, bleeding, or passing clots. Tell your caregiver of the amount and how many pads are used. Light spotting in pregnancy is common, especially following intercourse.  You develop a bad smelling vaginal discharge with a change in the color from clear to white.  You continue to feel   sick to your stomach (nauseated) and have no relief from remedies suggested. You vomit blood or coffee ground-like materials.  You lose more than 2 pounds of weight or gain more than 2 pounds of weight over 1 week, or as suggested by your caregiver.  You notice swelling of your face, hands, feet, or legs.  You get exposed to German measles and have never had them.  You are exposed to fifth disease or chickenpox.  You develop belly (abdominal) pain. Round ligament discomfort is a common non-cancerous (benign) cause of abdominal pain in pregnancy. Your caregiver still must evaluate you.  You develop a bad headache that does not go away.  You develop fever, diarrhea, pain with urination, or shortness of breath.  You develop visual problems, blurry, or double vision.  You fall or are in a car accident or any kind of trauma.  There is mental or physical violence at home. Document Released: 05/24/2001 Document Revised: 02/22/2012 Document Reviewed: 11/26/2008 [employer] Patient Information 2014 [employer].  

## 2012-12-31 NOTE — Progress Notes (Signed)
+  FM no lof, no VB, No Ctx No complaints today Pending 1hr glucose Reviewed MFM consult

## 2012-12-31 NOTE — Progress Notes (Signed)
Pulse: 91

## 2013-01-01 LAB — RPR

## 2013-01-02 ENCOUNTER — Other Ambulatory Visit: Payer: Self-pay | Admitting: Obstetrics & Gynecology

## 2013-01-02 DIAGNOSIS — R9389 Abnormal findings on diagnostic imaging of other specified body structures: Secondary | ICD-10-CM

## 2013-01-02 LAB — CULTURE, OB URINE: Colony Count: NO GROWTH

## 2013-01-04 ENCOUNTER — Ambulatory Visit (HOSPITAL_COMMUNITY)
Admission: RE | Admit: 2013-01-04 | Discharge: 2013-01-04 | Disposition: A | Discharge status: Home / Self Care | Payer: Medicaid Other | Source: Ambulatory Visit | Attending: Obstetrics and Gynecology | Admitting: Obstetrics and Gynecology

## 2013-01-04 VITALS — BP 126/64 | HR 87 | Wt 159.5 lb

## 2013-01-04 DIAGNOSIS — R9389 Abnormal findings on diagnostic imaging of other specified body structures: Secondary | ICD-10-CM

## 2013-01-04 DIAGNOSIS — Z3689 Encounter for other specified antenatal screening: Secondary | ICD-10-CM | POA: Insufficient documentation

## 2013-01-04 DIAGNOSIS — O358XX0 Maternal care for other (suspected) fetal abnormality and damage, not applicable or unspecified: Secondary | ICD-10-CM | POA: Insufficient documentation

## 2013-01-04 DIAGNOSIS — O358XX1 Maternal care for other (suspected) fetal abnormality and damage, fetus 1: Secondary | ICD-10-CM

## 2013-01-04 NOTE — Progress Notes (Signed)
Melanie Moore  was seen today for an ultrasound appointment.  See full report in [facility].  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  The right renal artery was not visualized on color flow Doppler Interval growth is appropriate (38th %tile) Normal amniotic fluid volume  Recommendations: Recommend follow-up ultrasound examination in 4 weeks. Will need ultrasound of the newborn after delivery to confirm prenatal ultrasound findings.  Alpha Gula, MD

## 2013-01-05 ENCOUNTER — Encounter: Payer: Self-pay | Admitting: Obstetrics & Gynecology

## 2013-01-21 ENCOUNTER — Ambulatory Visit (INDEPENDENT_AMBULATORY_CARE_PROVIDER_SITE_OTHER): Payer: Medicaid Other | Admitting: Obstetrics and Gynecology

## 2013-01-21 VITALS — BP 95/62 | Wt 161.7 lb

## 2013-01-21 DIAGNOSIS — O4693 Antepartum hemorrhage, unspecified, third trimester: Secondary | ICD-10-CM

## 2013-01-21 DIAGNOSIS — O359XX0 Maternal care for (suspected) fetal abnormality and damage, unspecified, not applicable or unspecified: Secondary | ICD-10-CM

## 2013-01-21 DIAGNOSIS — O4690 Antepartum hemorrhage, unspecified, unspecified trimester: Secondary | ICD-10-CM

## 2013-01-21 DIAGNOSIS — O359XX4 Maternal care for (suspected) fetal abnormality and damage, unspecified, fetus 4: Secondary | ICD-10-CM

## 2013-01-21 LAB — POCT URINALYSIS DIP (DEVICE)
Protein, ur: NEGATIVE mg/dL
Specific Gravity, Urine: 1.02 (ref 1.005–1.030)
Urobilinogen, UA: 0.2 mg/dL (ref 0.0–1.0)
pH: 6.5 (ref 5.0–8.0)

## 2013-01-21 NOTE — Progress Notes (Signed)
Pulse- 81 Patient reports spotting after intercourse

## 2013-01-21 NOTE — Progress Notes (Signed)
VB: Denies spotting today. No irritative vaginal d/c, UCs or LOF. Good FM.  Has MFM [place] scheduled 8/22. Also will get PP [place] d/t fetal left renal agenesis 1 hr glucola 135>advised no added simple sugars to diet. Undecided contraception>reviewed LARC

## 2013-01-21 NOTE — Patient Instructions (Signed)
Contraception Choices  Contraception (birth control) is the use of any methods or devices to prevent pregnancy. Below are some methods to help avoid pregnancy.  HORMONAL METHODS   · Contraceptive implant. This is a thin, plastic tube containing progesterone hormone. It does not contain estrogen hormone. Your caregiver inserts the tube in the inner part of the upper arm. The tube can remain in place for up to 3 years. After 3 years, the implant must be removed. The implant prevents the ovaries from releasing an egg (ovulation), thickens the cervical mucus which prevents sperm from entering the uterus, and thins the lining of the inside of the uterus.  · Progesterone-only injections. These injections are given every 3 months by your caregiver to prevent pregnancy. This synthetic progesterone hormone stops the ovaries from releasing eggs. It also thickens cervical mucus and changes the uterine lining. This makes it harder for sperm to survive in the uterus.  · Birth control pills. These pills contain estrogen and progesterone hormone. They work by stopping the egg from forming in the ovary (ovulation). Birth control pills are prescribed by a caregiver. Birth control pills can also be used to treat heavy periods.  · Minipill. This type of birth control pill contains only the progesterone hormone. They are taken every day of each month and must be prescribed by your caregiver.  · Birth control patch. The patch contains hormones similar to those in birth control pills. It must be changed once a week and is prescribed by a caregiver.  · Vaginal ring. The ring contains hormones similar to those in birth control pills. It is left in the vagina for 3 weeks, removed for 1 week, and then a new one is put back in place. The patient must be comfortable inserting and removing the ring from the vagina. A caregiver's prescription is necessary.  · Emergency contraception. Emergency contraceptives prevent pregnancy after unprotected  sexual intercourse. This pill can be taken right after sex or up to 5 days after unprotected sex. It is most effective the sooner you take the pills after having sexual intercourse. Emergency contraceptive pills are available without a prescription. Check with your pharmacist. Do not use emergency contraception as your only form of birth control.  BARRIER METHODS   · Female condom. This is a thin sheath (latex or rubber) that is worn over the penis during sexual intercourse. It can be used with spermicide to increase effectiveness.  · Female condom. This is a soft, loose-fitting sheath that is put into the vagina before sexual intercourse.  · Diaphragm. This is a soft, latex, dome-shaped barrier that must be fitted by a caregiver. It is inserted into the vagina, along with a spermicidal jelly. It is inserted before intercourse. The diaphragm should be left in the vagina for 6 to 8 hours after intercourse.  · Cervical cap. This is a round, soft, latex or plastic cup that fits over the cervix and must be fitted by a caregiver. The cap can be left in place for up to 48 hours after intercourse.  · Sponge. This is a soft, circular piece of polyurethane foam. The sponge has spermicide in it. It is inserted into the vagina after wetting it and before sexual intercourse.  · Spermicides. These are chemicals that kill or block sperm from entering the cervix and uterus. They come in the form of creams, jellies, suppositories, foam, or tablets. They do not require a prescription. They are inserted into the vagina with an applicator before having sexual intercourse.   The process must be repeated every time you have sexual intercourse.  INTRAUTERINE CONTRACEPTION  · Intrauterine device (IUD). This is a T-shaped device that is put in a woman's uterus during a menstrual period to prevent pregnancy. There are 2 types:  · Copper IUD. This type of IUD is wrapped in copper wire and is placed inside the uterus. Copper makes the uterus and  fallopian tubes produce a fluid that kills sperm. It can stay in place for 10 years.  · Hormone IUD. This type of IUD contains the hormone progestin (synthetic progesterone). The hormone thickens the cervical mucus and prevents sperm from entering the uterus, and it also thins the uterine lining to prevent implantation of a fertilized egg. The hormone can weaken or kill the sperm that get into the uterus. It can stay in place for 5 years.  PERMANENT METHODS OF CONTRACEPTION  · Female tubal ligation. This is when the woman's fallopian tubes are surgically sealed, tied, or blocked to prevent the egg from traveling to the uterus.  · Female sterilization. This is when the female has the tubes that carry sperm tied off (vasectomy). This blocks sperm from entering the vagina during sexual intercourse. After the procedure, the man can still ejaculate fluid (semen).  NATURAL PLANNING METHODS  · Natural family planning. This is not having sexual intercourse or using a barrier method (condom, diaphragm, cervical cap) on days the woman could become pregnant.  · Calendar method. This is keeping track of the length of each menstrual cycle and identifying when you are fertile.  · Ovulation method. This is avoiding sexual intercourse during ovulation.  · Symptothermal method. This is avoiding sexual intercourse during ovulation, using a thermometer and ovulation symptoms.  · Post-ovulation method. This is timing sexual intercourse after you have ovulated.  Regardless of which type or method of contraception you choose, it is important that you use condoms to protect against the transmission of sexually transmitted diseases (STDs). Talk with your caregiver about which form of contraception is most appropriate for you.  Document Released: 05/30/2005 Document Revised: 08/22/2011 Document Reviewed: 10/06/2010  [employer]® Patient Information ©2014 [employer].

## 2013-02-01 ENCOUNTER — Ambulatory Visit (HOSPITAL_COMMUNITY)
Admission: RE | Admit: 2013-02-01 | Discharge: 2013-02-01 | Disposition: A | Discharge status: Home / Self Care | Payer: Medicaid Other | Source: Ambulatory Visit | Attending: Obstetrics & Gynecology | Admitting: Obstetrics & Gynecology

## 2013-02-01 ENCOUNTER — Encounter (HOSPITAL_COMMUNITY): Payer: Self-pay

## 2013-02-01 VITALS — BP 107/66 | HR 98 | Wt 166.0 lb

## 2013-02-01 DIAGNOSIS — O358XX0 Maternal care for other (suspected) fetal abnormality and damage, not applicable or unspecified: Secondary | ICD-10-CM | POA: Insufficient documentation

## 2013-02-01 DIAGNOSIS — O358XX1 Maternal care for other (suspected) fetal abnormality and damage, fetus 1: Secondary | ICD-10-CM

## 2013-02-01 DIAGNOSIS — Z3689 Encounter for other specified antenatal screening: Secondary | ICD-10-CM | POA: Insufficient documentation

## 2013-02-01 NOTE — Progress Notes (Signed)
Melanie Moore  was seen today for an ultrasound appointment.  See full report in [facility].  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  The right renal artery was not visualized on color flow Doppler Interval growth is appropriate (33rd %tile) Normal amniotic fluid volume  Recommendations: Recommend follow-up ultrasound examination in 4 weeks.  Alpha Gula, MD

## 2013-02-04 ENCOUNTER — Encounter: Payer: Self-pay | Admitting: Family Medicine

## 2013-02-04 ENCOUNTER — Ambulatory Visit (INDEPENDENT_AMBULATORY_CARE_PROVIDER_SITE_OTHER): Payer: Medicaid Other | Admitting: Obstetrics & Gynecology

## 2013-02-04 VITALS — BP 104/66 | Temp 97.6°F | Wt 165.3 lb

## 2013-02-04 DIAGNOSIS — O0933 Supervision of pregnancy with insufficient antenatal care, third trimester: Secondary | ICD-10-CM

## 2013-02-04 DIAGNOSIS — O0930 Supervision of pregnancy with insufficient antenatal care, unspecified trimester: Secondary | ICD-10-CM

## 2013-02-04 LAB — POCT URINALYSIS DIP (DEVICE)
Glucose, UA: NEGATIVE mg/dL
Nitrite: NEGATIVE
Protein, ur: NEGATIVE mg/dL
Urobilinogen, UA: 0.2 mg/dL (ref 0.0–1.0)

## 2013-02-04 NOTE — Progress Notes (Signed)
Still some spotting as before. No UC, LOF, itching. Low back pain, mild constiption, try Metamucil

## 2013-02-04 NOTE — Progress Notes (Signed)
Pulse- 87 Patient reports spotting

## 2013-02-04 NOTE — Patient Instructions (Addendum)
Back Pain in Pregnancy Back pain during pregnancy is common. It happens in about half of all pregnancies. It is important for you and your baby that you remain active during your pregnancy.If you feel that back pain is not allowing you to remain active or sleep well, it is time to see your caregiver. Back pain may be caused by several factors related to changes during your pregnancy.Fortunately, unless you had trouble with your back before your pregnancy, the pain is likely to get better after you deliver. Low back pain usually occurs between the fifth and seventh months of pregnancy. It can, however, happen in the first couple months. Factors that increase the risk of back problems include:   Previous back problems.  Injury to your back.  Having twins or multiple births.  A chronic cough.  Stress.  Job-related repetitive motions.  Muscle or spinal disease in the back.  Family history of back problems, ruptured (herniated) discs, or osteoporosis.  Depression, anxiety, and panic attacks. CAUSES   When you are pregnant, your body produces a hormone called relaxin. This hormonemakes the ligaments connecting the low back and pubic bones more flexible. This flexibility allows the baby to be delivered more easily. When your ligaments are loose, your muscles need to work harder to support your back. Soreness in your back can come from tired muscles. Soreness can also come from back tissues that are irritated since they are receiving less support.  As the baby grows, it puts pressure on the nerves and blood vessels in your pelvis. This can cause back pain.  As the baby grows and gets heavier during pregnancy, the uterus pushes the stomach muscles forward and changes your center of gravity. This makes your back muscles work harder to maintain good posture. SYMPTOMS  Lumbar pain during pregnancy Lumbar pain during pregnancy usually occurs at or above the waist in the center of the back. There  may be pain and numbness that radiates into your leg or foot. This is similar to low back pain experienced by non-pregnant women. It usually increases with sitting for long periods of time, standing, or repetitive lifting. Tenderness may also be present in the muscles along your upper back. Posterior pelvic pain during pregnancy Pain in the back of the pelvis is more common than lumbar pain in pregnancy. It is a deep pain felt in your side at the waistline, or across the tailbone (sacrum), or in both places. You may have pain on one or both sides. This pain can also go into the buttocks and backs of the upper thighs. Pubic and groin pain may also be present. The pain does not quickly resolve with rest, and morning stiffness may also be present. Pelvic pain during pregnancy can be brought on by most activities. A high level of fitness before and during pregnancy may or may not prevent this problem. Labor pain is usually 1 to 2 minutes apart, lasts for about 1 minute, and involves a bearing down feeling or pressure in your pelvis. However, if you are at term with the pregnancy, constant low back pain can be the beginning of early labor, and you should be aware of this. DIAGNOSIS  X-rays of the back should not be done during the first 12 to 14 weeks of the pregnancy and only when absolutely necessary during the rest of the pregnancy. MRIs do not give off radiation and are safe during pregnancy. MRIs also should only be done when absolutely necessary. HOME CARE INSTRUCTIONS  Exercise   as directed by your caregiver. Exercise is the most effective way to prevent or manage back pain. If you have a back problem, it is especially important to avoid sports that require sudden body movements. Swimming and walking are great activities.  Do not stand in one place for long periods of time.  Do not wear high heels.  Sit in chairs with good posture. Use a pillow on your lower back if necessary. Make sure your head  rests over your shoulders and is not hanging forward.  Try sleeping on your side, preferably the left side, with a pillow or two between your legs. If you are sore after a night's rest, your bedmay betoo soft.Try placing a board between your mattress and box spring.  Listen to your body when lifting.If you are experiencing pain, ask for help or try bending yourknees more so you can use your leg muscles rather than your back muscles. Squat down when picking up something from the floor. Do not bend over.  Eat a healthy diet. Try to gain weight within your caregiver's recommendations.  Use heat or cold packs 3 to 4 times a day for 15 minutes to help with the pain.  Only take over-the-counter or prescription medicines for pain, discomfort, or fever as directed by your caregiver. Sudden (acute) back pain  Use bed rest for only the most extreme, acute episodes of back pain. Prolonged bed rest over 48 hours will aggravate your condition.  Ice is very effective for acute conditions.  Put ice in a plastic bag.  Place a towel between your skin and the bag.  Leave the ice on for 10 to 20 minutes every 2 hours, or as needed.  Using heat packs for 30 minutes prior to activities is also helpful. Continued back pain See your caregiver if you have continued problems. Your caregiver can help or refer you for appropriate physical therapy. With conditioning, most back problems can be avoided. Sometimes, a more serious issue may be the cause of back pain. You should be seen right away if new problems seem to be developing. Your caregiver may recommend:  A maternity girdle.  An elastic sling.  A back brace.  A massage therapist or acupuncture. SEEK MEDICAL CARE IF:   You are not able to do most of your daily activities, even when taking the pain medicine you were given.  You need a referral to a physical therapist or chiropractor.  You want to try acupuncture. SEEK IMMEDIATE MEDICAL CARE  IF:  You develop numbness, tingling, weakness, or problems with the use of your arms or legs.  You develop severe back pain that is no longer relieved with medicines.  You have a sudden change in bowel or bladder control.  You have increasing pain in other areas of the body.  You develop shortness of breath, dizziness, or fainting.  You develop nausea, vomiting, or sweating.  You have back pain which is similar to labor pains.  You have back pain along with your water breaking or vaginal bleeding.  You have back pain or numbness that travels down your leg.  Your back pain developed after you fell.  You develop pain on one side of your back. You may have a kidney stone.  You see blood in your urine. You may have a bladder infection or kidney stone.  You have back pain with blisters. You may have shingles. Back pain is fairly common during pregnancy but should not be accepted as just part of   the process. Back pain should always be treated as soon as possible. This will make your pregnancy as pleasant as possible. Document Released: 09/07/2005 Document Revised: 08/22/2011 Document Reviewed: 10/19/2010 [facility] Patient Information 2014 [place], [place]. Constipation, Adult Constipation is when a person has fewer than 3 bowel movements a week; has difficulty having a bowel movement; or has stools that are dry, hard, or larger than normal. As people grow older, constipation is more common. If you try to fix constipation with medicines that make you have a bowel movement (laxatives), the problem may get worse. Long-term laxative use may cause the muscles of the colon to become weak. A low-fiber diet, not taking in enough fluids, and taking certain medicines may make constipation worse. CAUSES  Certain medicines, such as antidepressants, pain medicine, iron supplements, antacids, and water pills.  Certain diseases, such as diabetes, irritable bowel syndrome (IBS), thyroid disease, or  depression.  Not drinking enough water.  Not eating enough fiber-rich foods.  Stress or travel. Lack of physical activity or exercise. Not going to the restroom when there is the urge to have a bowel movement. Ignoring the urge to have a bowel movement. Using laxatives too much. SYMPTOMS  Having fewer than 3 bowel movements a week.  Straining to have a bowel movement.  Having hard, dry, or larger than normal stools.  Feeling full or bloated.  Pain in the lower abdomen. Not feeling relief after having a bowel movement. DIAGNOSIS  Your caregiver will take a medical history and perform a physical exam. Further testing may be done for severe constipation. Some tests may include:  A barium enema X-ray to examine your rectum, colon, and sometimes, your small intestine. A sigmoidoscopy to examine your lower colon. A colonoscopy to examine your entire colon. TREATMENT  Treatment will depend on the severity of your constipation and what is causing it. Some dietary treatments include drinking more fluids and eating more fiber-rich foods. Lifestyle treatments may include regular exercise. If these diet and lifestyle recommendations do not help, your caregiver may recommend taking over-the-counter laxative medicines to help you have bowel movements. Prescription medicines may be prescribed if over-the-counter medicines do not work.  HOME CARE INSTRUCTIONS  Increase dietary fiber in your diet, such as fruits, vegetables, whole grains, and beans. Limit high-fat and processed sugars in your diet, such as [place] fries, hamburgers, cookies, candies, and soda.  A fiber supplement may be added to your diet if you cannot get enough fiber from foods.  Drink enough fluids to keep your urine clear or pale yellow.  Exercise regularly or as directed by your caregiver.  Go to the restroom when you have the urge to go. Do not hold it. Only take medicines as directed by your caregiver. Do not take other  medicines for constipation without talking to your caregiver first. SEEK IMMEDIATE MEDICAL CARE IF:  You have bright red blood in your stool.  Your constipation lasts for more than 4 days or gets worse.  You have abdominal or rectal pain.  You have thin, pencil-like stools. You have unexplained weight loss. MAKE SURE YOU:  Understand these instructions. Will watch your condition. Will get help right away if you are not doing well or get worse. Document Released: 02/26/2004 Document Revised: 08/22/2011 Document Reviewed: 05/03/2011 [facility] Patient Information 2014 [place], [place].

## 2013-02-18 ENCOUNTER — Ambulatory Visit (INDEPENDENT_AMBULATORY_CARE_PROVIDER_SITE_OTHER): Payer: Medicaid Other | Admitting: Obstetrics and Gynecology

## 2013-02-18 VITALS — BP 105/71 | Temp 98.6°F | Wt 167.2 lb

## 2013-02-18 DIAGNOSIS — O0930 Supervision of pregnancy with insufficient antenatal care, unspecified trimester: Secondary | ICD-10-CM

## 2013-02-18 LAB — POCT URINALYSIS DIP (DEVICE)
Bilirubin Urine: NEGATIVE
Nitrite: NEGATIVE
Protein, ur: NEGATIVE mg/dL
Urobilinogen, UA: 0.2 mg/dL (ref 0.0–1.0)
pH: 7 (ref 5.0–8.0)

## 2013-02-18 NOTE — Patient Instructions (Signed)
Fetal Movement Counts Patient Name: __________________________________________________ Patient Due Date: ____________________ Performing a fetal movement count is highly recommended in high-risk pregnancies, but it is good for every pregnant woman to do. Your caregiver may ask you to start counting fetal movements at 28 weeks of the pregnancy. Fetal movements often increase:  After eating a full meal.  After physical activity.  After eating or drinking something sweet or cold.  At rest. Pay attention to when you feel the baby is most active. This will help you notice a pattern of your baby's sleep and wake cycles and what factors contribute to an increase in fetal movement. It is important to perform a fetal movement count at the same time each day when your baby is normally most active.  HOW TO COUNT FETAL MOVEMENTS 1. Find a quiet and comfortable area to sit or lie down on your left side. Lying on your left side provides the best blood and oxygen circulation to your baby. 2. Write down the day and time on a sheet of paper or in a journal. 3. Start counting kicks, flutters, swishes, rolls, or jabs in a 2 hour period. You should feel at least 10 movements within 2 hours. 4. If you do not feel 10 movements in 2 hours, wait 2 3 hours and count again. Look for a change in the pattern or not enough counts in 2 hours. SEEK MEDICAL CARE IF:  You feel less than 10 counts in 2 hours, tried twice.  There is no movement in over an hour.  The pattern is changing or taking longer each day to reach 10 counts in 2 hours.  You feel the baby is not moving as he or she usually does. Date: ____________ Movements: ____________ Start time: ____________ Finish time: ____________  Date: ____________ Movements: ____________ Start time: ____________ Finish time: ____________ Date: ____________ Movements: ____________ Start time: ____________ Finish time: ____________ Date: ____________ Movements: ____________  Start time: ____________ Finish time: ____________ Date: ____________ Movements: ____________ Start time: ____________ Finish time: ____________ Date: ____________ Movements: ____________ Start time: ____________ Finish time: ____________ Date: ____________ Movements: ____________ Start time: ____________ Finish time: ____________ Date: ____________ Movements: ____________ Start time: ____________ Finish time: ____________  Date: ____________ Movements: ____________ Start time: ____________ Finish time: ____________ Date: ____________ Movements: ____________ Start time: ____________ Finish time: ____________ Date: ____________ Movements: ____________ Start time: ____________ Finish time: ____________ Date: ____________ Movements: ____________ Start time: ____________ Finish time: ____________ Date: ____________ Movements: ____________ Start time: ____________ Finish time: ____________ Date: ____________ Movements: ____________ Start time: ____________ Finish time: ____________ Date: ____________ Movements: ____________ Start time: ____________ Finish time: ____________  Date: ____________ Movements: ____________ Start time: ____________ Finish time: ____________ Date: ____________ Movements: ____________ Start time: ____________ Finish time: ____________ Date: ____________ Movements: ____________ Start time: ____________ Finish time: ____________ Date: ____________ Movements: ____________ Start time: ____________ Finish time: ____________ Date: ____________ Movements: ____________ Start time: ____________ Finish time: ____________ Date: ____________ Movements: ____________ Start time: ____________ Finish time: ____________ Date: ____________ Movements: ____________ Start time: ____________ Finish time: ____________  Date: ____________ Movements: ____________ Start time: ____________ Finish time: ____________ Date: ____________ Movements: ____________ Start time: ____________ Finish time:  ____________ Date: ____________ Movements: ____________ Start time: ____________ Finish time: ____________ Date: ____________ Movements: ____________ Start time: ____________ Finish time: ____________ Date: ____________ Movements: ____________ Start time: ____________ Finish time: ____________ Date: ____________ Movements: ____________ Start time: ____________ Finish time: ____________ Date: ____________ Movements: ____________ Start time: ____________ Finish time: ____________  Date: ____________ Movements: ____________ Start time: ____________ Finish   time: ____________ Date: ____________ Movements: ____________ Start time: ____________ Finish time: ____________ Date: ____________ Movements: ____________ Start time: ____________ Finish time: ____________ Date: ____________ Movements: ____________ Start time: ____________ Finish time: ____________ Date: ____________ Movements: ____________ Start time: ____________ Finish time: ____________ Date: ____________ Movements: ____________ Start time: ____________ Finish time: ____________ Date: ____________ Movements: ____________ Start time: ____________ Finish time: ____________  Date: ____________ Movements: ____________ Start time: ____________ Finish time: ____________ Date: ____________ Movements: ____________ Start time: ____________ Finish time: ____________ Date: ____________ Movements: ____________ Start time: ____________ Finish time: ____________ Date: ____________ Movements: ____________ Start time: ____________ Finish time: ____________ Date: ____________ Movements: ____________ Start time: ____________ Finish time: ____________ Date: ____________ Movements: ____________ Start time: ____________ Finish time: ____________ Date: ____________ Movements: ____________ Start time: ____________ Finish time: ____________  Date: ____________ Movements: ____________ Start time: ____________ Finish time: ____________ Date: ____________ Movements:  ____________ Start time: ____________ Finish time: ____________ Date: ____________ Movements: ____________ Start time: ____________ Finish time: ____________ Date: ____________ Movements: ____________ Start time: ____________ Finish time: ____________ Date: ____________ Movements: ____________ Start time: ____________ Finish time: ____________ Date: ____________ Movements: ____________ Start time: ____________ Finish time: ____________ Date: ____________ Movements: ____________ Start time: ____________ Finish time: ____________  Date: ____________ Movements: ____________ Start time: ____________ Finish time: ____________ Date: ____________ Movements: ____________ Start time: ____________ Finish time: ____________ Date: ____________ Movements: ____________ Start time: ____________ Finish time: ____________ Date: ____________ Movements: ____________ Start time: ____________ Finish time: ____________ Date: ____________ Movements: ____________ Start time: ____________ Finish time: ____________ Date: ____________ Movements: ____________ Start time: ____________ Finish time: ____________ Document Released: 06/29/2006 Document Revised: 05/16/2012 Document Reviewed: 03/26/2012 [employer] Patient Information 2014 [employer].  

## 2013-02-18 NOTE — Progress Notes (Signed)
Pulse- 79 Pressure-"When he moves"

## 2013-02-18 NOTE — Progress Notes (Signed)
[place] 33rd%ile. F/U 9/19 MFM d/t/ rt renal agenesis. Discussed breastfeeeding, Depo.

## 2013-03-01 ENCOUNTER — Ambulatory Visit (HOSPITAL_COMMUNITY)
Admission: RE | Admit: 2013-03-01 | Discharge: 2013-03-01 | Disposition: A | Discharge status: Home / Self Care | Payer: Medicaid Other | Source: Ambulatory Visit | Attending: Obstetrics and Gynecology | Admitting: Obstetrics and Gynecology

## 2013-03-01 VITALS — BP 118/72 | HR 79 | Wt 173.0 lb

## 2013-03-01 DIAGNOSIS — O358XX1 Maternal care for other (suspected) fetal abnormality and damage, fetus 1: Secondary | ICD-10-CM

## 2013-03-01 DIAGNOSIS — O358XX0 Maternal care for other (suspected) fetal abnormality and damage, not applicable or unspecified: Secondary | ICD-10-CM | POA: Insufficient documentation

## 2013-03-01 NOTE — ED Notes (Signed)
Pt states some spotting on and off.

## 2013-03-01 NOTE — Progress Notes (Signed)
Melanie Moore  was seen today for an ultrasound appointment.  See full report in [facility].  Impression: Single IUP at 36 0/7 weeks Interval fetal growth is appropriate (24th %tile) Absent right kidney Amniotic fluid volume is subjectively decreased (AFI 5.8 cm)  Recommendations: Recommend twice weekly nonstress tests with amniotic fluid volume assessment.  If AFI < 5 cm, would recommend induction of labor at 37 weeks.  Alpha Gula, MD

## 2013-03-04 ENCOUNTER — Encounter: Payer: Self-pay | Admitting: Advanced Practice Midwife

## 2013-03-04 ENCOUNTER — Ambulatory Visit (INDEPENDENT_AMBULATORY_CARE_PROVIDER_SITE_OTHER): Payer: Medicaid Other | Admitting: Advanced Practice Midwife

## 2013-03-04 VITALS — BP 121/76 | Temp 97.2°F | Wt 170.3 lb

## 2013-03-04 DIAGNOSIS — O0930 Supervision of pregnancy with insufficient antenatal care, unspecified trimester: Secondary | ICD-10-CM

## 2013-03-04 LAB — POCT URINALYSIS DIP (DEVICE)
Hgb urine dipstick: NEGATIVE
Ketones, ur: NEGATIVE mg/dL
Protein, ur: NEGATIVE mg/dL
Specific Gravity, Urine: 1.02 (ref 1.005–1.030)
Urobilinogen, UA: 0.2 mg/dL (ref 0.0–1.0)
pH: 6.5 (ref 5.0–8.0)

## 2013-03-04 NOTE — Progress Notes (Signed)
Pulse- 77  Edema-hands/feet/face  Pain/pressure- when wake up on back have pain   Vaginal discharge-  White with blood tinge

## 2013-03-04 NOTE — Progress Notes (Signed)
Doing well.  Good fetal movement, denies LOF, regular contractions.  Reports white discharge and saw some pink tinge yesterday, none today.  No itching/burning. No blood on glove with cervical exam. Trace edema of extremities.

## 2013-03-05 ENCOUNTER — Inpatient Hospital Stay (HOSPITAL_COMMUNITY)
Admission: AD | Admit: 2013-03-05 | Discharge: 2013-03-09 | DRG: 775 | Disposition: A | Discharge status: Home / Self Care | Payer: Medicaid Other | Source: Ambulatory Visit | Attending: Obstetrics and Gynecology | Admitting: Obstetrics and Gynecology

## 2013-03-05 ENCOUNTER — Other Ambulatory Visit (HOSPITAL_COMMUNITY): Payer: Self-pay | Admitting: Maternal and Fetal Medicine

## 2013-03-05 ENCOUNTER — Encounter (HOSPITAL_COMMUNITY): Payer: Self-pay

## 2013-03-05 ENCOUNTER — Ambulatory Visit (HOSPITAL_COMMUNITY)
Admission: RE | Admit: 2013-03-05 | Discharge: 2013-03-05 | Disposition: A | Discharge status: Home / Self Care | Payer: Medicaid Other | Source: Ambulatory Visit | Attending: Obstetrics and Gynecology | Admitting: Obstetrics and Gynecology

## 2013-03-05 DIAGNOSIS — O358XX1 Maternal care for other (suspected) fetal abnormality and damage, fetus 1: Secondary | ICD-10-CM

## 2013-03-05 DIAGNOSIS — O0930 Supervision of pregnancy with insufficient antenatal care, unspecified trimester: Secondary | ICD-10-CM

## 2013-03-05 DIAGNOSIS — O4100X0 Oligohydramnios, unspecified trimester, not applicable or unspecified: Principal | ICD-10-CM | POA: Diagnosis present

## 2013-03-05 DIAGNOSIS — O358XX0 Maternal care for other (suspected) fetal abnormality and damage, not applicable or unspecified: Secondary | ICD-10-CM | POA: Diagnosis present

## 2013-03-05 LAB — CBC
Hemoglobin: 13.8 g/dL (ref 12.0–15.0)
MCHC: 34.1 g/dL (ref 30.0–36.0)
MCV: 86.9 fL (ref 78.0–100.0)
Platelets: 211 10*3/uL (ref 150–400)
RDW: 13.9 % (ref 11.5–15.5)
WBC: 12 10*3/uL — ABNORMAL HIGH (ref 4.0–10.5)

## 2013-03-05 LAB — GC/CHLAMYDIA PROBE AMP: GC Probe RNA: NEGATIVE

## 2013-03-05 LAB — TYPE AND SCREEN
ABO/RH(D): A POS
Antibody Screen: NEGATIVE

## 2013-03-05 MED ORDER — LACTATED RINGERS IV SOLN
500.0000 mL | INTRAVENOUS | Status: DC | PRN
  Administered 2013-03-06: 500 mL via INTRAVENOUS

## 2013-03-05 MED ORDER — ACETAMINOPHEN 325 MG PO TABS: 650.0000 mg | ORAL_TABLET | ORAL | Status: DC | PRN

## 2013-03-05 MED ORDER — CITRIC ACID-SODIUM CITRATE 334-500 MG/5ML PO SOLN: 30.0000 mL | ORAL | Status: DC | PRN

## 2013-03-05 MED ORDER — LIDOCAINE HCL (PF) 1 % IJ SOLN
30.0000 mL | INTRAMUSCULAR | Status: DC | PRN
  Filled 2013-03-05: qty 30

## 2013-03-05 MED ORDER — OXYTOCIN BOLUS FROM INFUSION: 500.0000 mL | INTRAVENOUS | Status: DC

## 2013-03-05 MED ORDER — ONDANSETRON HCL 4 MG/2ML IJ SOLN
4.0000 mg | Freq: Four times a day (QID) | INTRAMUSCULAR | Status: DC | PRN
  Administered 2013-03-07: 4 mg via INTRAVENOUS
  Filled 2013-03-05: qty 2

## 2013-03-05 MED ORDER — PENICILLIN G POTASSIUM 5000000 UNITS IJ SOLR
5.0000 10*6.[IU] | Freq: Once | INTRAVENOUS | Status: AC
  Administered 2013-03-05: 5 10*6.[IU] via INTRAVENOUS
  Filled 2013-03-05: qty 5

## 2013-03-05 MED ORDER — TERBUTALINE SULFATE 1 MG/ML IJ SOLN: 0.2500 mg | Freq: Once | INTRAMUSCULAR | Status: AC | PRN

## 2013-03-05 MED ORDER — LACTATED RINGERS IV SOLN
INTRAVENOUS | Status: DC
  Administered 2013-03-06 – 2013-03-07 (×4): via INTRAVENOUS

## 2013-03-05 MED ORDER — MISOPROSTOL 25 MCG QUARTER TABLET
25.0000 ug | ORAL_TABLET | ORAL | Status: DC | PRN
  Administered 2013-03-05 (×2): 25 ug via VAGINAL
  Filled 2013-03-05: qty 0.25
  Filled 2013-03-05: qty 1
  Filled 2013-03-05 (×2): qty 0.25

## 2013-03-05 MED ORDER — PENICILLIN G POTASSIUM 5000000 UNITS IJ SOLR
2.5000 10*6.[IU] | INTRAVENOUS | Status: DC
  Administered 2013-03-05 – 2013-03-07 (×10): 2.5 10*6.[IU] via INTRAVENOUS
  Filled 2013-03-05 (×15): qty 2.5

## 2013-03-05 MED ORDER — OXYCODONE-ACETAMINOPHEN 5-325 MG PO TABS
1.0000 | ORAL_TABLET | ORAL | Status: DC | PRN
Start: 2013-03-05 — End: 2013-03-09

## 2013-03-05 MED ORDER — IBUPROFEN 600 MG PO TABS
600.0000 mg | ORAL_TABLET | Freq: Four times a day (QID) | ORAL | Status: DC | PRN
  Administered 2013-03-07 – 2013-03-08 (×2): 600 mg via ORAL
  Filled 2013-03-05 (×3): qty 1

## 2013-03-05 MED ORDER — OXYTOCIN 40 UNITS IN LACTATED RINGERS INFUSION - SIMPLE MED: 62.5000 mL/h | INTRAVENOUS | Status: DC

## 2013-03-05 NOTE — H&P (Signed)
Melanie Moore is a 22 y.o. female presenting for induction of labor oligohydramnios. Maternal Medical History:  Reason for admission: Nausea.    OB History   Grav Para Term Preterm Abortions TAB SAB Ect Mult Living   1              Past Medical History  Diagnosis Date  . Medical history non-contributory   . 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 Eye surgery  2008    sty removed both eyes   Family History: family history includes Depression in her mother; Diabetes in her father, maternal grandfather, paternal grandfather, and paternal grandmother; Fibromyalgia in her mother; Hyperlipidemia in her father. Social History:  reports that she has never smoked. She has never used smokeless tobacco. She reports that she does not drink alcohol or use illicit drugs.   Prenatal Transfer Tool  Maternal Diabetes: No Genetic Screening: Too late Maternal Ultrasounds/Referrals: Abnormal:  Findings:   Fetal Kidney Anomalies, Other: Right kidney agenesis and oligohydramnios Fetal Ultrasounds or other Referrals:  Referred to Materal Fetal Medicine  Maternal Substance Abuse:  No Significant Maternal Medications:  None Significant Maternal Lab Results:  Lab values include: Other: GBS unknown Other Comments:  None  Review of Systems  Constitutional: Negative for fever.  Gastrointestinal: Negative for nausea, vomiting, diarrhea and constipation.  Genitourinary: Negative for dysuria.  Neurological: Negative for seizures, loss of consciousness and headaches.      Last menstrual period 06/08/2012.   Fetal Exam Fetal Monitor Review: Mode: ultrasound.   Baseline rate: 135.  Variability: moderate (6-25 bpm).   Pattern: accelerations present and no decelerations.    Fetal State Assessment: Category I - tracings are normal.    UC: None  Physical Exam   Nursing note and vitals reviewed. Constitutional: She is oriented to person, place, and time. She appears well-developed and well-nourished. No distress.  Cardiovascular: Normal rate, regular rhythm, normal heart sounds and intact distal pulses.   Respiratory: Effort normal and breath sounds normal. No respiratory distress. She has no wheezes. She has no rales.  GI: Soft. Bowel sounds are normal. There is no tenderness. There is no rebound and no guarding.  Musculoskeletal: Normal range of motion. She exhibits no edema and no tenderness.  Neurological: She is alert and oriented to person, place, and time.  Skin: Skin is warm and dry. No rash noted. She is not diaphoretic. No erythema.    Prenatal labs: ABO, Rh: --/--/A POS, A POS (05/06 2135) Antibody: NEG (05/06 2135) Rubella: 5.96 (05/06 2135) RPR: NON REAC (07/21 1103)  HBsAg: NEGATIVE (05/06 2135)  HIV: Non-reactive (05/06 0000)  GBS:  Unknown  Assessment/Plan: Melanie Moore is a 22 y.o. G1P0 at [redacted]w[redacted]d who is late to prenatal care admitted for induction of labor due to oligohydramnios with an AFI of 3.3cm  #IOL due to oligohydramnios - admit to birthing suites - routine labor orders - Pain: plans on using no pain medication but considering IV and epidural - start induction with Cytotec PV - GBS status unknown: obtain culture and start prophylaxis with penicillin  #Postpartum - Method of feeding: Breast - Method of contraception: Depo Provera - Circumcision: Boy; desires    Jacquelin Hawking 03/05/2013, 12:19 PM Evaluation and management procedures were performed by Resident physician under my supervision/collaboration. Chart reviewed, patient examined by me and I agree with  management and plan. Cx loose 1 long -3

## 2013-03-05 NOTE — Progress Notes (Signed)
Melanie Moore  was seen today for an ultrasound appointment.  See full report in [facility].  Impression: Single IUP at 36 4/7 weeks Fetal right renal agenesis Reactive NST Oligohydramnios is noted with an AFI of 3.3 cm  Recommendations: Recommend induction of labor due to persisent oligohydramnios and right renal agenesis Patient to return to L&D later this morning to begin induction of labor.  Alpha Gula, MD

## 2013-03-05 NOTE — Progress Notes (Signed)
Pt sitting up eating dinner

## 2013-03-06 MED ORDER — FENTANYL CITRATE 0.05 MG/ML IJ SOLN
100.0000 ug | INTRAMUSCULAR | Status: DC | PRN
  Administered 2013-03-06 (×2): 100 ug via INTRAVENOUS
  Filled 2013-03-06 (×2): qty 2

## 2013-03-06 MED ORDER — PHENYLEPHRINE 40 MCG/ML (10ML) SYRINGE FOR IV PUSH (FOR BLOOD PRESSURE SUPPORT)
80.0000 ug | PREFILLED_SYRINGE | INTRAVENOUS | Status: DC | PRN
Start: 2013-03-06 — End: 2013-03-07
  Filled 2013-03-06: qty 2

## 2013-03-06 MED ORDER — PHENYLEPHRINE 40 MCG/ML (10ML) SYRINGE FOR IV PUSH (FOR BLOOD PRESSURE SUPPORT)
80.0000 ug | PREFILLED_SYRINGE | INTRAVENOUS | Status: DC | PRN
  Filled 2013-03-06: qty 5
  Filled 2013-03-06: qty 2

## 2013-03-06 MED ORDER — TERBUTALINE SULFATE 1 MG/ML IJ SOLN: 0.2500 mg | Freq: Once | INTRAMUSCULAR | Status: AC | PRN

## 2013-03-06 MED ORDER — EPHEDRINE 5 MG/ML INJ
10.0000 mg | INTRAVENOUS | Status: DC | PRN
  Filled 2013-03-06: qty 2
  Filled 2013-03-06: qty 4

## 2013-03-06 MED ORDER — LACTATED RINGERS IV SOLN: 500.0000 mL | Freq: Once | INTRAVENOUS | Status: DC

## 2013-03-06 MED ORDER — DIPHENHYDRAMINE HCL 50 MG/ML IJ SOLN: 12.5000 mg | INTRAMUSCULAR | Status: DC | PRN

## 2013-03-06 MED ORDER — FENTANYL 2.5 MCG/ML BUPIVACAINE 1/10 % EPIDURAL INFUSION (WH - ANES)
14.0000 mL/h | INTRAMUSCULAR | Status: DC | PRN
  Administered 2013-03-07 (×2): 14 mL/h via EPIDURAL
  Filled 2013-03-06 (×2): qty 125

## 2013-03-06 MED ORDER — OXYTOCIN 40 UNITS IN LACTATED RINGERS INFUSION - SIMPLE MED
1.0000 m[IU]/min | INTRAVENOUS | Status: DC
  Administered 2013-03-06: 2 m[IU]/min via INTRAVENOUS
  Filled 2013-03-06: qty 1000

## 2013-03-06 MED ORDER — EPHEDRINE 5 MG/ML INJ
10.0000 mg | INTRAVENOUS | Status: DC | PRN
  Filled 2013-03-06: qty 2

## 2013-03-06 NOTE — Progress Notes (Signed)
I have seen and examined this patient and I agree with the above. SHAW, KIMBERLY 3:04 AM 03/06/2013    

## 2013-03-06 NOTE — Progress Notes (Signed)
Pt sitting up eating breakfast.

## 2013-03-06 NOTE — H&P (Signed)
Attestation of Attending Supervision of Advanced Practitioner (CNM/NP): Evaluation and management procedures were performed by the Advanced Practitioner under my supervision and collaboration. I have reviewed the Advanced Practitioner's note and chart, and I agree with the management and plan.  Vinie Charity H. 7:16 AM

## 2013-03-06 NOTE — Progress Notes (Signed)
Melanie Moore is a 22 y.o. G1P0 at [redacted]w[redacted]d byadmitted for induction of labor due to Low amniotic fluid..  Subjective: Doing well. Reports she hasn't been feeling any contractions with the cytotec - has gotten 2 doses so far.   Objective: BP 113/71   Pulse 72   Temp(Src) 98.7 F (37.1 C) (Oral)   Resp 20   Ht 5\' 7"  (1.702 m)   Wt 77.565 kg (171 lb)   BMI 26.78 kg/m2   LMP 06/08/2012      FHT:  FHR: 150 bpm, variability: moderate,  accelerations:  Present,  decelerations:  Absent UC:   none SVE:   Dilation: 1.5 Effacement (%): Thick Station: -3 Exam by:: Dr. Madie Reno  Labs: Lab Results  Component Value Date   WBC 12.0* 03/05/2013   HGB 13.8 03/05/2013   HCT 40.5 03/05/2013   MCV 86.9 03/05/2013   PLT 211 03/05/2013    Assessment / Plan: IOl for oligohydramnios, placed foley bulb  Labor: Progressing normally and foley bulb placed Fetal Wellbeing:  Category I Pain Control:  Labor support without medications I/D:  n/a Anticipated MOD:  NSVD  Marissa Calamity 03/06/2013, 2:12 AM

## 2013-03-06 NOTE — Progress Notes (Signed)
Patient ID: Mallery Harshman, female   DOB: 1990/08/12, 22 y.o.   MRN: [id number] Shirly Bartosiewicz is a 22 y.o. G1P0 at [redacted]w[redacted]d admitted for IOL due to oligohydramnios with AFI 3.3 and right fetal renal agenesis  Subjective: Comfortable. Increased bloody show.  Objective: BP 122/79  Pulse 78  Temp(Src) 98.5 F (36.9 C) (Oral)  Resp 20  Ht 5\' 7"  (1.702 m)  Wt 77.565 kg (171 lb)  BMI 26.78 kg/m2  LMP 06/08/2012  Fetal Heart FHR: 135 bpm, variability: moderate,  accelerations:  Present,  decelerations:  Absent   Contractions: rare mild  SVE: Double lumen FB with distal bulb in vaginal, proximal bulb partly through cx> removed Cx 3-4/50/-3, heavy show  Assessment / Plan:  Labor: Cx ripened>start pitocin Fetal Wellbeing: Category 1 FHR tracing Pain Control:  n/a Expected mode of delivery: NSVD  Lashon Hillier 03/06/2013, 1:04 PM

## 2013-03-07 ENCOUNTER — Inpatient Hospital Stay (HOSPITAL_COMMUNITY): Payer: Medicaid Other | Admitting: Anesthesiology

## 2013-03-07 ENCOUNTER — Encounter (HOSPITAL_COMMUNITY): Payer: Self-pay | Admitting: Anesthesiology

## 2013-03-07 ENCOUNTER — Encounter (HOSPITAL_COMMUNITY): Payer: Self-pay | Admitting: *Deleted

## 2013-03-07 DIAGNOSIS — O358XX0 Maternal care for other (suspected) fetal abnormality and damage, not applicable or unspecified: Secondary | ICD-10-CM

## 2013-03-07 DIAGNOSIS — O4100X0 Oligohydramnios, unspecified trimester, not applicable or unspecified: Secondary | ICD-10-CM

## 2013-03-07 MED ORDER — LANOLIN HYDROUS EX OINT: TOPICAL_OINTMENT | CUTANEOUS | Status: DC | PRN

## 2013-03-07 MED ORDER — WITCH HAZEL-GLYCERIN EX PADS: 1.0000 "application " | MEDICATED_PAD | CUTANEOUS | Status: DC | PRN

## 2013-03-07 MED ORDER — SODIUM BICARBONATE 8.4 % IV SOLN
INTRAVENOUS | Status: DC | PRN
  Administered 2013-03-07: 5 mL via EPIDURAL

## 2013-03-07 MED ORDER — SENNOSIDES-DOCUSATE SODIUM 8.6-50 MG PO TABS
2.0000 | ORAL_TABLET | ORAL | Status: DC
  Administered 2013-03-08: 2 via ORAL

## 2013-03-07 MED ORDER — SIMETHICONE 80 MG PO CHEW
80.0000 mg | CHEWABLE_TABLET | ORAL | Status: DC | PRN
  Administered 2013-03-07: 80 mg via ORAL

## 2013-03-07 MED ORDER — ONDANSETRON HCL 4 MG PO TABS: 4.0000 mg | ORAL_TABLET | ORAL | Status: DC | PRN

## 2013-03-07 MED ORDER — DIBUCAINE 1 % RE OINT: 1.0000 "application " | TOPICAL_OINTMENT | RECTAL | Status: DC | PRN

## 2013-03-07 MED ORDER — IBUPROFEN 600 MG PO TABS
600.0000 mg | ORAL_TABLET | Freq: Four times a day (QID) | ORAL | Status: DC
  Administered 2013-03-07 – 2013-03-09 (×7): 600 mg via ORAL
  Filled 2013-03-07 (×6): qty 1

## 2013-03-07 MED ORDER — BENZOCAINE-MENTHOL 20-0.5 % EX AERO
1.0000 "application " | INHALATION_SPRAY | CUTANEOUS | Status: DC | PRN
  Filled 2013-03-07: qty 56

## 2013-03-07 MED ORDER — PRENATAL MULTIVITAMIN CH
1.0000 | ORAL_TABLET | Freq: Every day | ORAL | Status: DC
  Administered 2013-03-08 – 2013-03-09 (×2): 1 via ORAL
  Filled 2013-03-07 (×2): qty 1

## 2013-03-07 MED ORDER — DIPHENHYDRAMINE HCL 25 MG PO CAPS: 25.0000 mg | ORAL_CAPSULE | Freq: Four times a day (QID) | ORAL | Status: DC | PRN

## 2013-03-07 MED ORDER — TETANUS-DIPHTH-ACELL PERTUSSIS 5-2.5-18.5 LF-MCG/0.5 IM SUSP
0.5000 mL | Freq: Once | INTRAMUSCULAR | Status: AC
  Administered 2013-03-08: 0.5 mL via INTRAMUSCULAR
  Filled 2013-03-07: qty 0.5

## 2013-03-07 MED ORDER — ZOLPIDEM TARTRATE 5 MG PO TABS: 5.0000 mg | ORAL_TABLET | Freq: Every evening | ORAL | Status: DC | PRN

## 2013-03-07 MED ORDER — ONDANSETRON HCL 4 MG/2ML IJ SOLN: 4.0000 mg | INTRAMUSCULAR | Status: DC | PRN

## 2013-03-07 MED ORDER — OXYCODONE-ACETAMINOPHEN 5-325 MG PO TABS: 1.0000 | ORAL_TABLET | ORAL | Status: DC | PRN

## 2013-03-07 NOTE — Anesthesia Procedure Notes (Signed)

## 2013-03-07 NOTE — Anesthesia Preprocedure Evaluation (Signed)

## 2013-03-07 NOTE — Progress Notes (Signed)
UR completed 

## 2013-03-08 ENCOUNTER — Other Ambulatory Visit (HOSPITAL_COMMUNITY): Payer: Medicaid Other

## 2013-03-08 ENCOUNTER — Ambulatory Visit (HOSPITAL_COMMUNITY): Payer: Medicaid Other

## 2013-03-08 LAB — CBC
MCHC: 34 g/dL (ref 30.0–36.0)
MCV: 88.4 fL (ref 78.0–100.0)
Platelets: 155 10*3/uL (ref 150–400)
RDW: 14.4 % (ref 11.5–15.5)
WBC: 18.3 10*3/uL — ABNORMAL HIGH (ref 4.0–10.5)

## 2013-03-08 NOTE — Anesthesia Postprocedure Evaluation (Signed)
Anesthesia Post Note  Patient: Melanie Moore  Procedure(s) Performed: * No procedures listed *  Anesthesia type: Epidural  Patient location: Mother/Baby  Post pain: Pain level controlled  Post assessment: Post-op Vital signs reviewed  Last Vitals:  Filed Vitals:   03/08/13 0545  BP: 100/65  Pulse: 76  Temp: 36.8 C  Resp: 18    Post vital signs: Reviewed  Level of consciousness: awake  Complications: No apparent anesthesia complications

## 2013-03-08 NOTE — Progress Notes (Signed)
Post Partum Day 1 Subjective: no complaints, up ad lib, voiding and tolerating PO, small lochia, plans to breastfeed, Depo-Provera  Objective: Blood pressure 100/65, pulse 76, temperature 98.3 F (36.8 C), temperature source Oral, resp. rate 18, height 5\' 7"  (1.702 m), weight 77.565 kg (171 lb), last menstrual period 06/08/2012, SpO2 99.00%, unknown if currently breastfeeding.  Physical Exam:  General: alert, cooperative and no distress Lochia:normal flow Chest: CTAB Heart: RRR no m/r/g Abdomen: +BS, soft, nontender,  Uterine Fundus: firm DVT Evaluation: No evidence of DVT seen on physical exam. Extremities: no edema   Recent Labs  03/05/13 1305 03/08/13 0600  HGB 13.8 10.4*  HCT 40.5 30.6*    Assessment/Plan: Plan for discharge tomorrow and Lactation consult   LOS: 3 days   CRESENZO-DISHMAN,Annise Boran 03/08/2013, 7:19 AM

## 2013-03-08 NOTE — Lactation Note (Signed)
This note was copied from the chart of Melanie Moore. Lactation Consultation Note Initial consultation with this first time mom. Mom states breast feeding is going well so far. Mom states she would like some help with different positions. Assisted mom to get baby in football on the right. However, baby went sound asleep at the breast. Inst mom to call for help with next feeding if she has any concerns.  Reviewed baby and me book br feeding basics with mom. Reviewed lactation brochure, community resources.  Enc mom to call for help if needed.   Patient Name: Melanie Moore [id number] Date: 03/08/2013 Reason for consult: Initial assessment   Maternal Data Formula Feeding for Exclusion: No Has patient been taught Hand Expression?: Yes Does the patient have breastfeeding experience prior to this delivery?: No  Feeding Feeding Type: Breast Milk Length of feed: 0 min  LATCH Score/Interventions Latch: Too sleepy or reluctant, no latch achieved, no sucking elicited.                    Lactation Tools Discussed/Used     Consult Status Consult Status: Follow-up Follow-up type: In-patient    Octavio Manns [facility] 03/08/2013, 12:38 PM

## 2013-03-09 MED ORDER — IBUPROFEN 600 MG PO TABS: 600.0000 mg | ORAL_TABLET | Freq: Four times a day (QID) | ORAL | Status: DC

## 2013-03-09 NOTE — Discharge Summary (Signed)
Obstetric Discharge Summary Reason for Admission: induction of labor for oligohydramnios Prenatal Procedures: none Intrapartum Procedures: spontaneous vaginal delivery Postpartum Procedures: none Complications-Operative and Postpartum: none Hemoglobin  Date Value Range Status  03/08/2013 10.4* 12.0 - 15.0 g/dL Final     HCT  Date Value Range Status  03/08/2013 30.6* 36.0 - 46.0 % Final    Physical Exam:  General: alert, cooperative and no distress Lochia: appropriate Uterine Fundus: firm Incision: na DVT Evaluation: No evidence of DVT seen on physical exam. No cords or calf tenderness.  Discharge Diagnoses: preterm delivery  Discharge Information: Date: 03/09/2013 Activity: pelvic rest Diet: routine Medications: PNV and Ibuprofen Condition: stable Instructions: refer to practice specific booklet Discharge to: home Follow-up Information   Schedule an appointment as soon as possible for a visit with [facility].   Specialty:  Obstetrics and Gynecology   Contact information:   [address] [place] [address] [phone]      Newborn Data: Live born female  Birth Weight: 6 lb 2.8 oz (2800 g) APGAR: 7, 9  Home with mother.  Pt presented for IOL for oligo and progressed to deliver a liveborn female without complications via NSVD. Post partum care was uncomplicated. She is breast feeding and desires depo for contraception which she will get at [facility] on Monday.   Aspynn Clover L 03/09/2013, 8:01 AM

## 2013-03-11 ENCOUNTER — Encounter: Payer: Medicaid Other | Admitting: Obstetrics and Gynecology

## 2013-03-11 NOTE — Discharge Summary (Signed)
Attestation of Attending Supervision of Fellow: Evaluation and management procedures were performed by the Fellow under my supervision and collaboration.  I have reviewed the Fellow's note and chart, and I agree with the management and plan.    

## 2013-04-17 ENCOUNTER — Encounter: Payer: Self-pay | Admitting: Family Medicine

## 2013-04-17 ENCOUNTER — Ambulatory Visit (INDEPENDENT_AMBULATORY_CARE_PROVIDER_SITE_OTHER): Payer: Medicaid Other | Admitting: Family Medicine

## 2013-04-17 ENCOUNTER — Encounter: Payer: Self-pay | Admitting: Obstetrics & Gynecology

## 2013-04-17 VITALS — BP 130/78 | HR 84 | Temp 97.3°F | Ht 66.0 in | Wt 154.8 lb

## 2013-04-17 DIAGNOSIS — Z3202 Encounter for pregnancy test, result negative: Secondary | ICD-10-CM

## 2013-04-17 DIAGNOSIS — Z3049 Encounter for surveillance of other contraceptives: Secondary | ICD-10-CM

## 2013-04-17 MED ORDER — MEDROXYPROGESTERONE ACETATE 104 MG/0.65ML ~~LOC~~ SUSP
104.0000 mg | Freq: Once | SUBCUTANEOUS | Status: AC
  Administered 2013-04-17: 104 mg via SUBCUTANEOUS

## 2013-04-17 NOTE — Patient Instructions (Signed)

## 2013-04-17 NOTE — Progress Notes (Signed)
Subjective:     Melanie Moore is a 22 y.o. female who presents for a postpartum visit. She is 5 weeks postpartum following a spontaneous vaginal delivery. I have fully reviewed the prenatal and intrapartum course. The delivery was at 36 gestational weeks. Outcome: spontaneous vaginal delivery. Anesthesia: epidural. Postpartum course has been uncomplicated. Baby's course has been complicated by eval at [facility] for kidney agenesis. Baby is feeding by breast. Bleeding no bleeding. Bowel function is normal. Bladder function is normal. Patient is not sexually active. Contraception method is Depo-Provera injections. Postpartum depression screening: negative.  The following portions of the patient's history were reviewed and updated as appropriate: allergies, current medications, past family history, past medical history, past social history, past surgical history and problem list.  Review of Systems Pertinent items are noted in HPI.   Objective:    BP 130/78  Pulse 84  Temp(Src) 97.3 F (36.3 C) (Oral)  Ht 5\' 6"  (1.676 m)  Wt 154 lb 12.8 oz (70.217 kg)  BMI 25.00 kg/m2  General:  alert, cooperative and appears stated age   Breasts:  inspection negative, no nipple discharge or bleeding, no masses or nodularity palpable  Lungs: clear to auscultation bilaterally and normal percussion bilaterally  Heart:  regular rate and rhythm, S1, S2 normal, no murmur, click, rub or gallop  Abdomen: soft, non-tender; bowel sounds normal; no masses,  no organomegaly   Vulva:  not evluated  Vagina: not evaluated   No c/c/e                 Assessment:     Melanie Moore is doing well at her  postpartum exam. Pap smear not done, needs repeat 11/2013  Plan:    1. Contraception: Depo-Provera injections q84months.  2. Pt may return to intercourse, continue PNV, Recommend 2 years pregnancy 3. Follow up in: 8 months or as needed.

## 2013-04-18 ENCOUNTER — Other Ambulatory Visit: Payer: Self-pay

## 2013-07-10 ENCOUNTER — Ambulatory Visit: Payer: Medicaid Other

## 2014-04-14 ENCOUNTER — Encounter: Payer: Self-pay | Admitting: Family Medicine

## 2014-09-05 ENCOUNTER — Encounter (HOSPITAL_COMMUNITY): Payer: Self-pay

## 2014-09-05 ENCOUNTER — Inpatient Hospital Stay (HOSPITAL_COMMUNITY): Payer: Medicaid Other

## 2014-09-05 ENCOUNTER — Inpatient Hospital Stay (HOSPITAL_COMMUNITY)
Admission: AD | Admit: 2014-09-05 | Discharge: 2014-09-05 | Disposition: A | Discharge status: Home / Self Care | Payer: Medicaid Other | Source: Ambulatory Visit | Attending: Family Medicine | Admitting: Family Medicine

## 2014-09-05 DIAGNOSIS — O26891 Other specified pregnancy related conditions, first trimester: Secondary | ICD-10-CM

## 2014-09-05 DIAGNOSIS — O219 Vomiting of pregnancy, unspecified: Secondary | ICD-10-CM | POA: Diagnosis not present

## 2014-09-05 DIAGNOSIS — Z3A10 10 weeks gestation of pregnancy: Secondary | ICD-10-CM | POA: Diagnosis not present

## 2014-09-05 DIAGNOSIS — R102 Pelvic and perineal pain: Secondary | ICD-10-CM | POA: Insufficient documentation

## 2014-09-05 DIAGNOSIS — O9989 Other specified diseases and conditions complicating pregnancy, childbirth and the puerperium: Secondary | ICD-10-CM | POA: Insufficient documentation

## 2014-09-05 DIAGNOSIS — B9689 Other specified bacterial agents as the cause of diseases classified elsewhere: Secondary | ICD-10-CM

## 2014-09-05 DIAGNOSIS — N760 Acute vaginitis: Secondary | ICD-10-CM

## 2014-09-05 LAB — CBC
HEMATOCRIT: 38.6 % (ref 36.0–46.0)
Hemoglobin: 12.9 g/dL (ref 12.0–15.0)
MCH: 28.6 pg (ref 26.0–34.0)
MCHC: 33.4 g/dL (ref 30.0–36.0)
MCV: 85.6 fL (ref 78.0–100.0)
Platelets: 209 10*3/uL (ref 150–400)
RBC: 4.51 MIL/uL (ref 3.87–5.11)
RDW: 14.1 % (ref 11.5–15.5)
WBC: 12.3 10*3/uL — ABNORMAL HIGH (ref 4.0–10.5)

## 2014-09-05 LAB — URINALYSIS, ROUTINE W REFLEX MICROSCOPIC
Bilirubin Urine: NEGATIVE
Glucose, UA: NEGATIVE mg/dL
Hgb urine dipstick: NEGATIVE
Ketones, ur: NEGATIVE mg/dL
NITRITE: NEGATIVE
PH: 6 (ref 5.0–8.0)
Protein, ur: NEGATIVE mg/dL
Urobilinogen, UA: 0.2 mg/dL (ref 0.0–1.0)

## 2014-09-05 LAB — URINE MICROSCOPIC-ADD ON

## 2014-09-05 LAB — WET PREP, GENITAL
Trich, Wet Prep: NONE SEEN
YEAST WET PREP: NONE SEEN

## 2014-09-05 LAB — POCT PREGNANCY, URINE: PREG TEST UR: POSITIVE — AB

## 2014-09-05 LAB — HCG, QUANTITATIVE, PREGNANCY: hCG, Beta Chain, Quant, S: 171458 m[IU]/mL — ABNORMAL HIGH (ref <5)

## 2014-09-05 MED ORDER — METRONIDAZOLE 500 MG PO TABS: 500.0000 mg | ORAL_TABLET | Freq: Two times a day (BID) | ORAL | Status: DC

## 2014-09-05 MED ORDER — PROMETHAZINE HCL 12.5 MG PO TABS: 12.5000 mg | ORAL_TABLET | Freq: Four times a day (QID) | ORAL | Status: DC | PRN

## 2014-09-05 NOTE — MAU Provider Note (Signed)
Chief Complaint: Abdominal Pain   First Provider Initiated Contact with Patient 09/05/14 1950     SUBJECTIVE HPI: Melanie Moore is a 24 y.o. G2P0101 at [redacted]w[redacted]d by unsure LMP with +HPT who presents with mid lower abdominal cramping pain that has been intermittent. Feels like menstrual cramps. Onset was a few weeks week ago. Pain does not radiate. It is not associated with dysuria, frequency or urgency of urination. It is not exacerbated by moving. She has not taken any pain medication. Today at work it was severe enough to make her fell like fainting.  She also reports morning sickness and decreased appetite. She occasionally vomits.  NPC. Desires care at [facility] where she went with first pregnnacy. Son has renal anomaly, followed at [facility].  Past Medical History  Diagnosis Date  . Medical history non-contributory   . LGSIL (low grade squamous intraepithelial lesion) on Pap smear 11/19/2012    Repeat pap in one year as per ASCCP guidelines   OB History  Gravida Para Term Preterm AB SAB TAB Ectopic Multiple Living  # Outcome Date GA Lbr Len/2nd Weight Sex Delivery Anes PTL Lv  2 Current           1 Preterm 03/07/13 [redacted]w[redacted]d 07:27 / 05:38 2.8 kg (6 lb 2.8 oz) M Vag-Spont EPI  Y     Comments: WNL     Past Surgical History  Procedure Laterality Date  . Eye surgery  2008    sty removed both eyes   History   Social History  . Marital Status: Single    Spouse Name: N/A  . Number of Children: N/A  . Years of Education: N/A   Occupational History  . Not on file.   Social History Main Topics  . Smoking status: Never Smoker   . Smokeless tobacco: Never Used  . Alcohol Use: No     Comment: socially before pregnancy  . Drug Use: No  . Sexual Activity: Not Currently    Birth Control/ Protection: None   Other Topics Concern  . Not on file   Social History Narrative   No current facility-administered medications on file prior to encounter.   Current Outpatient  Prescriptions on File Prior to Encounter  Medication Sig Dispense Refill  . ibuprofen (ADVIL,MOTRIN) 600 MG tablet Take 1 tablet (600 mg total) by mouth every 6 (six) hours. 30 tablet 0  . Prenatal Vit-Fe Fumarate-FA (PRENATAL MULTIVITAMIN) TABS Take 1 tablet by mouth at bedtime.     No Known Allergies  ROS  OBJECTIVE Blood pressure 127/73, pulse 79, temperature 97.6 F (36.4 C), temperature source Oral, resp. rate 20, height  (1.702 m), weight 70.398 kg (155 lb 3.2 oz), last menstrual period 07/02/2014. GENERAL: Well-developed, well-nourished female in no acute distress.  HEART: normal rate RESP: normal effort GI: Abdomen soft, non-tender throughout MS: Nontender, no edema NEURO: Alert and oriented; nonfocal SPECULUM EXAM: NEFG, bubbly gray discharge, no blood noted, cervix clean BIMANUAL: cervix L/C; uterus 8-10 wk size, no adnexal tenderness or masses  LAB RESULTS Results for orders placed or performed during the hospital encounter of 09/05/14 (from the past 24 hour(s))  Urinalysis, Routine w reflex microscopic     Status: Abnormal   Collection Time: 09/05/14  5:36 PM  Result Value Ref Range   Color, Urine YELLOW YELLOW   APPearance CLEAR CLEAR   Specific Gravity, Urine >1.030 (H) 1.005 - 1.030  pH 6.0 5.0 - 8.0   Glucose, UA NEGATIVE NEGATIVE mg/dL   Hgb urine dipstick NEGATIVE NEGATIVE   Bilirubin Urine NEGATIVE NEGATIVE   Ketones, ur NEGATIVE NEGATIVE mg/dL   Protein, ur NEGATIVE NEGATIVE mg/dL   Urobilinogen, UA 0.2 0.0 - 1.0 mg/dL   Nitrite NEGATIVE NEGATIVE   Leukocytes, UA TRACE (A) NEGATIVE  Urine microscopic-add on     Status: Abnormal   Collection Time: 09/05/14  5:36 PM  Result Value Ref Range   Squamous Epithelial / LPF FEW (A) RARE   WBC, UA 0-2 <3 WBC/hpf   RBC / HPF 0-2 <3 RBC/hpf   Bacteria, UA MANY (A) RARE   Urine-Other MUCOUS PRESENT   Pregnancy, urine POC     Status: Abnormal   Collection Time: 09/05/14  5:51 PM  Result Value Ref Range    Preg Test, Ur POSITIVE (A) NEGATIVE  CBC     Status: Abnormal   Collection Time: 09/05/14  6:47 PM  Result Value Ref Range   WBC 12.3 (H) 4.0 - 10.5 K/uL   RBC 4.51 3.87 - 5.11 MIL/uL   Hemoglobin 12.9 12.0 - 15.0 g/dL   HCT 16.1 09.6 - 04.5 %   MCV 85.6 78.0 - 100.0 fL   MCH 28.6 26.0 - 34.0 pg   MCHC 33.4 30.0 - 36.0 g/dL   RDW 40.9 81.1 - 91.4 %   Platelets 209 150 - 400 K/uL  hCG, quantitative, pregnancy     Status: Abnormal   Collection Time: 09/05/14  6:47 PM  Result Value Ref Range   hCG, Beta Chain, Quant, S [id number] (H) <5 mIU/mL  Wet prep, genital     Status: Abnormal   Collection Time: 09/05/14  8:58 PM  Result Value Ref Range   Yeast Wet Prep HPF POC NONE SEEN NONE SEEN   Trich, Wet Prep NONE SEEN NONE SEEN   Clue Cells Wet Prep HPF POC FEW (A) NONE SEEN   WBC, Wet Prep HPF POC FEW (A) NONE SEEN    IMAGING   CLINICAL DATA: Left lower quadrant pelvic pain. Positive pregnancy test, quantitative beta HCG 171,458  EXAM: OBSTETRIC <14 WK ULTRASOUND  TECHNIQUE: Transabdominal ultrasound was performed for evaluation of the gestation as well as the maternal uterus and adnexal regions.  COMPARISON: None for this pregnancy  FINDINGS: Intrauterine gestational sac: Visualized/normal in shape.  Yolk sac: Visualized  Embryo: Visualized  Cardiac Activity: Visualized  Heart Rate: 156 Bpm  CRL: 39 mm 10 w 6d [place] EDC: 03/28/15  Maternal uterus/adnexae: Right ovary not visualized. No adnexal mass. Left ovary appears normal. No free fluid.  IMPRESSION: Single live intrauterine gestation measuring 10 weeks 6 days by crown-rump length today, suggesting best dating by today's exam. No acute abnormality.   Electronically Signed  By: Christiana Pellant M.D.  On: 09/05/2014 20:37     MAU COURSE GC/CT, HIV sent Urine culture sent  ASSESSMENT 1. Pelvic pain affecting pregnancy in first trimester, antepartum   Viable IUP  [redacted]w[redacted]d BV  PLAN Discharge home with pregnancy precautions    Medication List    STOP taking these medications        ibuprofen 600 MG tablet  Commonly known as:  ADVIL,MOTRIN      TAKE these medications        metroNIDAZOLE 500 MG tablet  Commonly known as:  FLAGYL  Take 1 tablet (500 mg total) by mouth 2 (two) times daily.     prenatal multivitamin Tabs tablet  Take  1 tablet by mouth at bedtime.     promethazine 12.5 MG tablet  Commonly known as:  PHENERGAN  Take 1 tablet (12.5 mg total) by mouth every 6 (six) hours as needed for nausea or vomiting.         Follow-up Information    [facility] In 1 week.   Specialty:  Obstetrics and Gynecology   Why:  Someone from Clinic will call you with appt.   Contact information:   [address] [place] [id number] [phone]       Danae OrleansDeirdre C Adil Tugwell, CNM 09/05/2014  7:51 PM

## 2014-09-05 NOTE — Discharge Instructions (Signed)

## 2014-09-05 NOTE — MAU Note (Signed)
Pt presents complaining of lower abdominal cramping that has been ongoing and states she almost passed out at work today. Pt is having a hard time eating because of morning sickness. +HPT. Denies vaginal bleeding and discharge.

## 2014-09-07 LAB — URINE CULTURE: Special Requests: NORMAL

## 2014-09-08 LAB — GC/CHLAMYDIA PROBE AMP (~~LOC~~) NOT AT ARMC
Chlamydia: NEGATIVE
NEISSERIA GONORRHEA: NEGATIVE

## 2014-09-22 ENCOUNTER — Ambulatory Visit (INDEPENDENT_AMBULATORY_CARE_PROVIDER_SITE_OTHER): Payer: Medicaid Other | Admitting: Obstetrics & Gynecology

## 2014-09-22 ENCOUNTER — Other Ambulatory Visit (HOSPITAL_COMMUNITY)
Admission: RE | Admit: 2014-09-22 | Discharge: 2014-09-22 | Disposition: A | Discharge status: Home / Self Care | Payer: Medicaid Other | Source: Ambulatory Visit | Attending: Obstetrics & Gynecology | Admitting: Obstetrics & Gynecology

## 2014-09-22 ENCOUNTER — Encounter: Payer: Self-pay | Admitting: Obstetrics & Gynecology

## 2014-09-22 ENCOUNTER — Other Ambulatory Visit: Payer: Self-pay | Admitting: Obstetrics & Gynecology

## 2014-09-22 VITALS — BP 113/76 | HR 93 | Temp 98.5°F | Wt 153.1 lb

## 2014-09-22 DIAGNOSIS — O219 Vomiting of pregnancy, unspecified: Secondary | ICD-10-CM

## 2014-09-22 DIAGNOSIS — Z3482 Encounter for supervision of other normal pregnancy, second trimester: Secondary | ICD-10-CM

## 2014-09-22 DIAGNOSIS — Z3480 Encounter for supervision of other normal pregnancy, unspecified trimester: Secondary | ICD-10-CM | POA: Insufficient documentation

## 2014-09-22 DIAGNOSIS — Z113 Encounter for screening for infections with a predominantly sexual mode of transmission: Secondary | ICD-10-CM | POA: Insufficient documentation

## 2014-09-22 DIAGNOSIS — Z23 Encounter for immunization: Secondary | ICD-10-CM | POA: Diagnosis present

## 2014-09-22 DIAGNOSIS — Z01411 Encounter for gynecological examination (general) (routine) with abnormal findings: Secondary | ICD-10-CM | POA: Diagnosis present

## 2014-09-22 DIAGNOSIS — O09292 Supervision of pregnancy with other poor reproductive or obstetric history, second trimester: Secondary | ICD-10-CM

## 2014-09-22 LAB — POCT URINALYSIS DIP (DEVICE)
Bilirubin Urine: NEGATIVE
GLUCOSE, UA: NEGATIVE mg/dL
Hgb urine dipstick: NEGATIVE
Ketones, ur: NEGATIVE mg/dL
NITRITE: NEGATIVE
Protein, ur: NEGATIVE mg/dL
Specific Gravity, Urine: 1.02 (ref 1.005–1.030)
UROBILINOGEN UA: 0.2 mg/dL (ref 0.0–1.0)
pH: 7 (ref 5.0–8.0)

## 2014-09-22 LAB — OB RESULTS CONSOLE GC/CHLAMYDIA: Gonorrhea: NEGATIVE

## 2014-09-22 MED ORDER — FAMOTIDINE 40 MG PO TABS: 40.0000 mg | ORAL_TABLET | Freq: Every evening | ORAL | Status: DC

## 2014-09-22 NOTE — Addendum Note (Signed)
Addended by: Kienan Doublin M on: 09/22/2014 02:56 PM   Modules accepted: Orders  

## 2014-09-22 NOTE — Patient Instructions (Signed)
Second Trimester of Pregnancy The second trimester is from week 13 through week 28, months 4 through 6. The second trimester is often a time when you feel your best. Your body has also adjusted to being pregnant, and you begin to feel better physically. Usually, morning sickness has lessened or quit completely, you may have more energy, and you may have an increase in appetite. The second trimester is also a time when the fetus is growing rapidly. At the end of the sixth month, the fetus is about 9 inches long and weighs about 1 pounds. You will likely begin to feel the baby move (quickening) between 18 and 20 weeks of the pregnancy. BODY CHANGES Your body goes through many changes during pregnancy. The changes vary from woman to woman.   Your weight will continue to increase. You will notice your lower abdomen bulging out.  You may begin to get stretch marks on your hips, abdomen, and breasts.  You may develop headaches that can be relieved by medicines approved by your health care provider.  You may urinate more often because the fetus is pressing on your bladder.  You may develop or continue to have heartburn as a result of your pregnancy.  You may develop constipation because certain hormones are causing the muscles that push waste through your intestines to slow down.  You may develop hemorrhoids or swollen, bulging veins (varicose veins).  You may have back pain because of the weight gain and pregnancy hormones relaxing your joints between the bones in your pelvis and as a result of a shift in weight and the muscles that support your balance.  Your breasts will continue to grow and be tender.  Your gums may bleed and may be sensitive to brushing and flossing.  Dark spots or blotches (chloasma, mask of pregnancy) may develop on your face. This will likely fade after the baby is born.  A dark line from your belly button to the pubic area (linea nigra) may appear. This will likely fade  after the baby is born.  You may have changes in your hair. These can include thickening of your hair, rapid growth, and changes in texture. Some women also have hair loss during or after pregnancy, or hair that feels dry or thin. Your hair will most likely return to normal after your baby is born. WHAT TO EXPECT AT YOUR PRENATAL VISITS During a routine prenatal visit:  You will be weighed to make sure you and the fetus are growing normally.  Your blood pressure will be taken.  Your abdomen will be measured to track your baby's growth.  The fetal heartbeat will be listened to.  Any test results from the previous visit will be discussed. Your health care provider may ask you:  How you are feeling.  If you are feeling the baby move.  If you have had any abnormal symptoms, such as leaking fluid, bleeding, severe headaches, or abdominal cramping.  If you have any questions. Other tests that may be performed during your second trimester include:  Blood tests that check for:  Low iron levels (anemia).  Gestational diabetes (between 24 and 28 weeks).  Rh antibodies.  Urine tests to check for infections, diabetes, or protein in the urine.  An ultrasound to confirm the proper growth and development of the baby.  An amniocentesis to check for possible genetic problems.  Fetal screens for spina bifida and Down syndrome. HOME CARE INSTRUCTIONS   Avoid all smoking, herbs, alcohol, and unprescribed   drugs. These chemicals affect the formation and growth of the baby.  Follow your health care provider's instructions regarding medicine use. There are medicines that are either safe or unsafe to take during pregnancy.  Exercise only as directed by your health care provider. Experiencing uterine cramps is a good sign to stop exercising.  Continue to eat regular, healthy meals.  Wear a good support bra for breast tenderness.  Do not use hot tubs, steam rooms, or saunas.  Wear your  seat belt at all times when driving.  Avoid raw meat, uncooked cheese, cat litter boxes, and soil used by cats. These carry germs that can cause birth defects in the baby.  Take your prenatal vitamins.  Try taking a stool softener (if your health care provider approves) if you develop constipation. Eat more high-fiber foods, such as fresh vegetables or fruit and whole grains. Drink plenty of fluids to keep your urine clear or pale yellow.  Take warm sitz baths to soothe any pain or discomfort caused by hemorrhoids. Use hemorrhoid cream if your health care provider approves.  If you develop varicose veins, wear support hose. Elevate your feet for 15 minutes, 3-4 times a day. Limit salt in your diet.  Avoid heavy lifting, wear low heel shoes, and practice good posture.  Rest with your legs elevated if you have leg cramps or low back pain.  Visit your dentist if you have not gone yet during your pregnancy. Use a soft toothbrush to brush your teeth and be gentle when you floss.  A sexual relationship may be continued unless your health care provider directs you otherwise.  Continue to go to all your prenatal visits as directed by your health care provider. SEEK MEDICAL CARE IF:   You have dizziness.  You have mild pelvic cramps, pelvic pressure, or nagging pain in the abdominal area.  You have persistent nausea, vomiting, or diarrhea.  You have a bad smelling vaginal discharge.  You have pain with urination. SEEK IMMEDIATE MEDICAL CARE IF:   You have a fever.  You are leaking fluid from your vagina.  You have spotting or bleeding from your vagina.  You have severe abdominal cramping or pain.  You have rapid weight gain or loss.  You have shortness of breath with chest pain.  You notice sudden or extreme swelling of your face, hands, ankles, feet, or legs.  You have not felt your baby move in over an hour.  You have severe headaches that do not go away with  medicine.  You have vision changes. Document Released: 05/24/2001 Document Revised: 06/04/2013 Document Reviewed: 07/31/2012 Exit[employer] Patient Information 2015 [employer]. This information is not intended to replace advice given to you by your health care provider. Make sure you discuss any questions you have with your health care provider.  

## 2014-09-22 NOTE — Progress Notes (Signed)
C/o of sharp lower back pain-- right side only.  Initial labs and early 1hr gtt (father diabetic)to be drawn at 1125.  New OB packet given.  Why Should I breast feeD? Information sheet given.  Home medicaid form given to patient to complete.

## 2014-09-22 NOTE — Progress Notes (Signed)
Moderate Leukocytes in urine.  

## 2014-09-22 NOTE — Progress Notes (Signed)
NOB. 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 Will have genetic counseling  Subjective:h/o fetal anomaly renal    Melanie Moore is a G2P0101 5239w2d being seen today for her first obstetrical visit.  Her obstetrical history is significant for fetal renal anomaly last pregnancy. Patient does intend to breast feed. Pregnancy history fully reviewed.  Patient reports heartburn and nausea.  Filed Vitals:   09/22/14 1023  BP: 113/76  Pulse: 93  Temp: 98.5 F (36.9 C)  Weight: 153 lb 1.6 oz (69.446 kg)    HISTORY: OB History  Gravida Para Term Preterm AB SAB TAB Ectopic Multiple Living  2 1  1      1     # Outcome Date GA Lbr Len/2nd Weight Sex Delivery Anes PTL Lv  2 Current           1 Preterm 03/07/13 4258w6d 07:27 / 05:38 6 lb 2.8 oz (2.8 kg) M Vag-Spont EPI  Y     Comments: WNL     Past Medical History  Diagnosis Date  . Medical history non-contributory   . 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 Eye surgery  2008    sty removed both eyes   Family History  Problem Relation Age of Onset  . Diabetes Father   . Hyperlipidemia Father   . Diabetes Maternal Grandfather   . Diabetes Paternal Grandmother   . Diabetes Paternal Grandfather   . Depression Mother   . Fibromyalgia Mother      Exam    Uterus:  Fundal Height: 14 cm  Pelvic Exam:    Perineum: No Hemorrhoids   Vulva: normal   Vagina:  normal mucosa   pH:     Cervix: no lesions   Adnexa: normal adnexa   Bony Pelvis: average  System: Breast:  normal appearance, no masses or tenderness   Skin: normal coloration and turgor, no rashes    Neurologic: oriented, normal mood   Extremities: normal strength, tone, and muscle mass   HEENT PERRLA and neck supple with midline trachea   Mouth/Teeth mucous membranes moist, pharynx normal  without lesions and dental hygiene good   Neck supple and no masses   Cardiovascular: regular rate and rhythm, no murmurs or gallops   Respiratory:  appears well, vitals normal, no respiratory distress, acyanotic, normal RR, neck free of mass or lymphadenopathy, chest clear, no wheezing, crepitations, rhonchi, normal symmetric air entry   Abdomen: soft, non-tender; bowel sounds normal; no masses,  no organomegaly   Urinary: urethral meatus normal      Assessment:    Pregnancy: [id number] Patient Active Problem List   Diagnosis Date Noted  . Supervision of other normal pregnancy, antepartum 09/22/2014  . Pregnancy related nausea and vomiting, antepartum 09/22/2014        Plan:     Initial labs drawn. Prenatal vitamins. Problem list reviewed and updated. Genetic Screening discussed First Screen: unable to schedule, no appts.  Ultrasound discussed; fetal survey: 18+ weeks.  Follow up in 4 weeks. 50% of 30 min visit spent on counseling and coordination of care.  Genetic counseling   ARNOLD,JAMES 09/22/2014

## 2014-09-22 NOTE — Progress Notes (Signed)
No appts avail for First Trimester Screen.  Genetic Counseling 10/08/14 @ 2p with [facility], per genetic counselor [facility] will perform an OB complete as well in order to provide proper counseling regarding pt concerns with previous h/o fetal renal anomaly.

## 2014-09-23 LAB — CULTURE, OB URINE

## 2014-09-23 LAB — PRESCRIPTION MONITORING PROFILE (19 PANEL)
AMPHETAMINE/METH: NEGATIVE ng/mL
Barbiturate Screen, Urine: NEGATIVE ng/mL
Benzodiazepine Screen, Urine: NEGATIVE ng/mL
Buprenorphine, Urine: NEGATIVE ng/mL
CARISOPRODOL, URINE: NEGATIVE ng/mL
COCAINE METABOLITES: NEGATIVE ng/mL
Cannabinoid Scrn, Ur: NEGATIVE ng/mL
Creatinine, Urine: 128.65 mg/dL (ref >20.0)
FENTANYL URINE: NEGATIVE ng/mL
MDMA URINE: NEGATIVE ng/mL
MEPERIDINE UR: NEGATIVE ng/mL
METHADONE SCREEN, URINE: NEGATIVE ng/mL
Methaqualone: NEGATIVE ng/mL
NITRITES URINE, INITIAL: NEGATIVE ug/mL
Opiate Screen, Urine: NEGATIVE ng/mL
Oxycodone Screen, Ur: NEGATIVE ng/mL
Phencyclidine, Ur: NEGATIVE ng/mL
Propoxyphene: NEGATIVE ng/mL
Tapentadol, urine: NEGATIVE ng/mL
Tramadol Scrn, Ur: NEGATIVE ng/mL
Zolpidem, Urine: NEGATIVE ng/mL
pH, Initial: 6.9 pH (ref 4.5–8.9)

## 2014-09-23 LAB — PRENATAL PROFILE (SOLSTAS)
Antibody Screen: NEGATIVE
BASOS ABS: 0 10*3/uL (ref 0.0–0.1)
Basophils Relative: 0 % (ref 0–1)
Eosinophils Absolute: 0 10*3/uL (ref 0.0–0.7)
Eosinophils Relative: 0 % (ref 0–5)
HCT: 40.5 % (ref 36.0–46.0)
HEMOGLOBIN: 13.7 g/dL (ref 12.0–15.0)
HEP B S AG: NEGATIVE
HIV: NONREACTIVE
LYMPHS PCT: 15 % (ref 12–46)
Lymphs Abs: 1.5 10*3/uL (ref 0.7–4.0)
MCH: 29.4 pg (ref 26.0–34.0)
MCHC: 33.8 g/dL (ref 30.0–36.0)
MCV: 86.9 fL (ref 78.0–100.0)
MPV: 10 fL (ref 8.6–12.4)
Monocytes Absolute: 0.8 10*3/uL (ref 0.1–1.0)
Monocytes Relative: 8 % (ref 3–12)
NEUTROS ABS: 7.9 10*3/uL — AB (ref 1.7–7.7)
Neutrophils Relative %: 77 % (ref 43–77)
Platelets: 245 10*3/uL (ref 150–400)
RBC: 4.66 MIL/uL (ref 3.87–5.11)
RDW: 14.1 % (ref 11.5–15.5)
Rh Type: POSITIVE
Rubella: 4.91 Index — ABNORMAL HIGH (ref <0.90)
WBC: 10.3 10*3/uL (ref 4.0–10.5)

## 2014-09-23 LAB — WET PREP, GENITAL
Trich, Wet Prep: NONE SEEN
Yeast Wet Prep HPF POC: NONE SEEN

## 2014-09-23 LAB — CYTOLOGY - PAP

## 2014-09-23 LAB — GLUCOSE TOLERANCE, 1 HOUR (50G) W/O FASTING: Glucose, 1 Hour GTT: 87 mg/dL (ref 70–140)

## 2014-09-24 LAB — HEMOGLOBINOPATHY EVALUATION
HEMOGLOBIN OTHER: 0 %
Hgb A2 Quant: 2.8 % (ref 2.2–3.2)
Hgb A: 97.2 % (ref 96.8–97.8)
Hgb F Quant: 0 % (ref 0.0–2.0)
Hgb S Quant: 0 %

## 2014-09-29 ENCOUNTER — Encounter: Payer: Self-pay | Admitting: Obstetrics and Gynecology

## 2014-09-29 DIAGNOSIS — O09299 Supervision of pregnancy with other poor reproductive or obstetric history, unspecified trimester: Secondary | ICD-10-CM | POA: Insufficient documentation

## 2014-10-08 ENCOUNTER — Ambulatory Visit (HOSPITAL_COMMUNITY)
Admission: RE | Admit: 2014-10-08 | Discharge: 2014-10-08 | Disposition: A | Discharge status: Home / Self Care | Payer: Medicaid Other | Source: Ambulatory Visit | Attending: Obstetrics & Gynecology | Admitting: Obstetrics & Gynecology

## 2014-10-08 ENCOUNTER — Encounter (HOSPITAL_COMMUNITY): Payer: Self-pay

## 2014-10-08 VITALS — BP 105/66 | HR 71 | Wt 157.8 lb

## 2014-10-08 DIAGNOSIS — Z3A15 15 weeks gestation of pregnancy: Secondary | ICD-10-CM | POA: Insufficient documentation

## 2014-10-08 DIAGNOSIS — Z36 Encounter for antenatal screening of mother: Secondary | ICD-10-CM | POA: Insufficient documentation

## 2014-10-08 DIAGNOSIS — O09292 Supervision of pregnancy with other poor reproductive or obstetric history, second trimester: Secondary | ICD-10-CM

## 2014-10-08 DIAGNOSIS — O352XX0 Maternal care for (suspected) hereditary disease in fetus, not applicable or unspecified: Secondary | ICD-10-CM

## 2014-10-08 DIAGNOSIS — Z3689 Encounter for other specified antenatal screening: Secondary | ICD-10-CM | POA: Insufficient documentation

## 2014-10-08 DIAGNOSIS — O09299 Supervision of pregnancy with other poor reproductive or obstetric history, unspecified trimester: Secondary | ICD-10-CM | POA: Insufficient documentation

## 2014-10-08 NOTE — Progress Notes (Signed)
Genetic Counseling  Visit Summary Note  Appointment Date: 10/08/2014 Referred By: Woodroe Mode, MD  Date of Birth: [birth year]  Pregnancy history: G2P0101 Estimated Date of Delivery: 03/28/15 Estimated Gestational Age: [redacted]w[redacted]d I met with Ms. MSherre Wootonand her partner, ISherlean Foot for genetic counseling because of a family history of renal anomalies in their first child.  We began by reviewing the family history in detail. Ms. VBowellreported that their son was born with a missing kidney and cysts in his other kidney.  They report that he has developmental delays, but no other significant health problems.  They recall that he had genetic testing performed and was found to have a deletion, but recalled little more of their genetics visit when he was young.  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  This deletion is approximately 3 MB in size and contains approximately 55 genes, some of which have been implicated in renal development. We discussed that this could be either denovo (new in the family and occuring for the first time in their son) or inherited.  If it were to have been inherited there could be as high as a 50% chance for a similar microdeletion in another pregnancy.  We reviewed chromosomes and discussed common patterns of inheritance.  Ms. VWilshireand Mr. JRonnald Rampwere both offered FISH analysis to determine if either of them has this same deletion.  They elected to be tested one at a time, so that if it was found in Ms. VNultythan they would not have to also pay for Mr. JRonnald Ramp testing.  Ms. VYehstated that she did not want her blood drawn at the time of the visit, but would return tomorrow for testing.  We discussed that if either of them were found to have the same deletion, prenatal testing could be performed  to identify the deletion.    The family histories were otherwise found to be noncontributory for birth defects, mental retardation, and known genetic conditions. Without further information regarding the provided family history, an accurate genetic risk cannot be calculated. Further genetic counseling is warranted if more information is obtained.  Ms. VSecrestwas provided with written information regarding sickle cell anemia (SCA) including the carrier frequency and incidence in various populations, the availability of carrier testing and prenatal diagnosis if indicated.  In addition, we discussed that hemoglobinopathies are routinely screened for as part of the [place] newborn screening panel.  She was counseled that hemoglobin electrophoresis has previously been performed and was reported to be normal.   Ms. VLeanodenied exposure to environmental toxins or chemical agents. She denied the use of alcohol, tobacco or street drugs. She denied significant viral illnesses during the course of her pregnancy. Her medical and surgical histories were noncontributory.   I counseled this couple regarding the above risks and available options.  The approximate face-to-face time with the genetic counselor was 60 minutes.  DCam Hai MS Certified Genetic Counselor

## 2014-10-09 ENCOUNTER — Ambulatory Visit (HOSPITAL_COMMUNITY)
Admission: RE | Admit: 2014-10-09 | Discharge: 2014-10-09 | Disposition: A | Discharge status: Home / Self Care | Payer: Medicaid Other | Source: Ambulatory Visit | Attending: Obstetrics & Gynecology | Admitting: Obstetrics & Gynecology

## 2014-10-09 DIAGNOSIS — O2692 Pregnancy related conditions, unspecified, second trimester: Secondary | ICD-10-CM | POA: Insufficient documentation

## 2014-10-09 LAB — ROUTINE CHROMOSOME - KARYOTYPE + FISH

## 2014-10-14 DIAGNOSIS — O352XX0 Maternal care for (suspected) hereditary disease in fetus, not applicable or unspecified: Secondary | ICD-10-CM | POA: Insufficient documentation

## 2014-10-23 ENCOUNTER — Encounter (HOSPITAL_COMMUNITY): Payer: Self-pay

## 2014-10-23 ENCOUNTER — Other Ambulatory Visit (HOSPITAL_COMMUNITY): Payer: Self-pay | Admitting: Maternal and Fetal Medicine

## 2014-10-23 ENCOUNTER — Ambulatory Visit (INDEPENDENT_AMBULATORY_CARE_PROVIDER_SITE_OTHER): Payer: Medicaid Other | Admitting: Certified Nurse Midwife

## 2014-10-23 ENCOUNTER — Ambulatory Visit (HOSPITAL_COMMUNITY)
Admission: RE | Admit: 2014-10-23 | Discharge: 2014-10-23 | Disposition: A | Discharge status: Home / Self Care | Payer: Medicaid Other | Source: Ambulatory Visit | Attending: Obstetrics & Gynecology | Admitting: Obstetrics & Gynecology

## 2014-10-23 VITALS — BP 109/61 | HR 74 | Temp 98.2°F | Wt 159.5 lb

## 2014-10-23 DIAGNOSIS — Z36 Encounter for antenatal screening of mother: Secondary | ICD-10-CM | POA: Diagnosis present

## 2014-10-23 DIAGNOSIS — Z3A00 Weeks of gestation of pregnancy not specified: Secondary | ICD-10-CM | POA: Insufficient documentation

## 2014-10-23 DIAGNOSIS — Z3492 Encounter for supervision of normal pregnancy, unspecified, second trimester: Secondary | ICD-10-CM

## 2014-10-23 DIAGNOSIS — O09292 Supervision of pregnancy with other poor reproductive or obstetric history, second trimester: Secondary | ICD-10-CM | POA: Diagnosis not present

## 2014-10-23 DIAGNOSIS — Z3A17 17 weeks gestation of pregnancy: Secondary | ICD-10-CM | POA: Insufficient documentation

## 2014-10-23 DIAGNOSIS — Z3689 Encounter for other specified antenatal screening: Secondary | ICD-10-CM

## 2014-10-23 LAB — POCT URINALYSIS DIP (DEVICE)
Bilirubin Urine: NEGATIVE
Glucose, UA: NEGATIVE mg/dL
Hgb urine dipstick: NEGATIVE
Ketones, ur: NEGATIVE mg/dL
Nitrite: NEGATIVE
Protein, ur: NEGATIVE mg/dL
Specific Gravity, Urine: >=1.03 (ref 1.005–1.030)
Urobilinogen, UA: 0.2 mg/dL (ref 0.0–1.0)
pH: 6 (ref 5.0–8.0)

## 2014-10-23 NOTE — Patient Instructions (Signed)
Sciatica Sciatica is pain, weakness, numbness, or tingling along the path of the sciatic nerve. The nerve starts in the lower back and runs down the back of each leg. The nerve controls the muscles in the lower leg and in the back of the knee, while also providing sensation to the back of the thigh, lower leg, and the sole of your foot. Sciatica is a symptom of another medical condition. For instance, nerve damage or certain conditions, such as a herniated disk or bone spur on the spine, pinch or put pressure on the sciatic nerve. This causes the pain, weakness, or other sensations normally associated with sciatica. Generally, sciatica only affects one side of the body. CAUSES   Herniated or slipped disc.  Degenerative disk disease.  A pain disorder involving the narrow muscle in the buttocks (piriformis syndrome).  Pelvic injury or fracture.  Pregnancy.  Tumor (rare). SYMPTOMS  Symptoms can vary from mild to very severe. The symptoms usually travel from the low back to the buttocks and down the back of the leg. Symptoms can include:  Mild tingling or dull aches in the lower back, leg, or hip.  Numbness in the back of the calf or sole of the foot.  Burning sensations in the lower back, leg, or hip.  Sharp pains in the lower back, leg, or hip.  Leg weakness.  Severe back pain inhibiting movement. These symptoms may get worse with coughing, sneezing, laughing, or prolonged sitting or standing. Also, being overweight may worsen symptoms. DIAGNOSIS  Your caregiver will perform a physical exam to look for common symptoms of sciatica. He or she may ask you to do certain movements or activities that would trigger sciatic nerve pain. Other tests may be performed to find the cause of the sciatica. These may include:  Blood tests.  X-rays.  Imaging tests, such as an MRI or CT scan. TREATMENT  Treatment is directed at the cause of the sciatic pain. Sometimes, treatment is not necessary  and the pain and discomfort goes away on its own. If treatment is needed, your caregiver may suggest:  Over-the-counter medicines to relieve pain.  Prescription medicines, such as anti-inflammatory medicine, muscle relaxants, or narcotics.  Applying heat or ice to the painful area.  Steroid injections to lessen pain, irritation, and inflammation around the nerve.  Reducing activity during periods of pain.  Exercising and stretching to strengthen your abdomen and improve flexibility of your spine. Your caregiver may suggest losing weight if the extra weight makes the back pain worse.  Physical therapy.  Surgery to eliminate what is pressing or pinching the nerve, such as a bone spur or part of a herniated disk. HOME CARE INSTRUCTIONS   Only take over-the-counter or prescription medicines for pain or discomfort as directed by your caregiver.  Apply ice to the affected area for 20 minutes, 3-4 times a day for the first 48-72 hours. Then try heat in the same way.  Exercise, stretch, or perform your usual activities if these do not aggravate your pain.  Attend physical therapy sessions as directed by your caregiver.  Keep all follow-up appointments as directed by your caregiver.  Do not wear high heels or shoes that do not provide proper support.  Check your mattress to see if it is too soft. A firm mattress may lessen your pain and discomfort. SEEK IMMEDIATE MEDICAL CARE IF:   You lose control of your bowel or bladder (incontinence).  You have increasing weakness in the lower back, pelvis, buttocks,   or legs.  You have redness or swelling of your back.  You have a burning sensation when you urinate.  You have pain that gets worse when you lie down or awakens you at night.  Your pain is worse than you have experienced in the past.  Your pain is lasting longer than 4 weeks.  You are suddenly losing weight without reason. MAKE SURE YOU:  Understand these  instructions.  Will watch your condition.  Will get help right away if you are not doing well or get worse. Document Released: 05/24/2001 Document Revised: 11/29/2011 Document Reviewed: 10/09/2011 Exit[employer] Patient Information 2015 [employer]. This information is not intended to replace advice given to you by your health care provider. Make sure you discuss any questions you have with your health care provider. Second Trimester of Pregnancy The second trimester is from week 13 through week 28, months 4 through 6. The second trimester is often a time when you feel your best. Your body has also adjusted to being pregnant, and you begin to feel better physically. Usually, morning sickness has lessened or quit completely, you may have more energy, and you may have an increase in appetite. The second trimester is also a time when the fetus is growing rapidly. At the end of the sixth month, the fetus is about 9 inches long and weighs about 1 pounds. You will likely begin to feel the baby move (quickening) between 18 and 20 weeks of the pregnancy. BODY CHANGES Your body goes through many changes during pregnancy. The changes vary from woman to woman.   Your weight will continue to increase. You will notice your lower abdomen bulging out.  You may begin to get stretch marks on your hips, abdomen, and breasts.  You may develop headaches that can be relieved by medicines approved by your health care provider.  You may urinate more often because the fetus is pressing on your bladder.  You may develop or continue to have heartburn as a result of your pregnancy.  You may develop constipation because certain hormones are causing the muscles that push waste through your intestines to slow down.  You may develop hemorrhoids or swollen, bulging veins (varicose veins).  You may have back pain because of the weight gain and pregnancy hormones relaxing your joints between the bones in your pelvis and as a  result of a shift in weight and the muscles that support your balance.  Your breasts will continue to grow and be tender.  Your gums may bleed and may be sensitive to brushing and flossing.  Dark spots or blotches (chloasma, mask of pregnancy) may develop on your face. This will likely fade after the baby is born.  A dark line from your belly button to the pubic area (linea nigra) may appear. This will likely fade after the baby is born.  You may have changes in your hair. These can include thickening of your hair, rapid growth, and changes in texture. Some women also have hair loss during or after pregnancy, or hair that feels dry or thin. Your hair will most likely return to normal after your baby is born. WHAT TO EXPECT AT YOUR PRENATAL VISITS During a routine prenatal visit:  You will be weighed to make sure you and the fetus are growing normally.  Your blood pressure will be taken.  Your abdomen will be measured to track your baby's growth.  The fetal heartbeat will be listened to.  Any test results from the previous   visit will be discussed. Your health care provider may ask you:  How you are feeling.  If you are feeling the baby move.  If you have had any abnormal symptoms, such as leaking fluid, bleeding, severe headaches, or abdominal cramping.  If you have any questions. Other tests that may be performed during your second trimester include:  Blood tests that check for:  Low iron levels (anemia).  Gestational diabetes (between 24 and 28 weeks).  Rh antibodies.  Urine tests to check for infections, diabetes, or protein in the urine.  An ultrasound to confirm the proper growth and development of the baby.  An amniocentesis to check for possible genetic problems.  Fetal screens for spina bifida and Down syndrome. HOME CARE INSTRUCTIONS   Avoid all smoking, herbs, alcohol, and unprescribed drugs. These chemicals affect the formation and growth of the  baby.  Follow your health care provider's instructions regarding medicine use. There are medicines that are either safe or unsafe to take during pregnancy.  Exercise only as directed by your health care provider. Experiencing uterine cramps is a good sign to stop exercising.  Continue to eat regular, healthy meals.  Wear a good support bra for breast tenderness.  Do not use hot tubs, steam rooms, or saunas.  Wear your seat belt at all times when driving.  Avoid raw meat, uncooked cheese, cat litter boxes, and soil used by cats. These carry germs that can cause birth defects in the baby.  Take your prenatal vitamins.  Try taking a stool softener (if your health care provider approves) if you develop constipation. Eat more high-fiber foods, such as fresh vegetables or fruit and whole grains. Drink plenty of fluids to keep your urine clear or pale yellow.  Take warm sitz baths to soothe any pain or discomfort caused by hemorrhoids. Use hemorrhoid cream if your health care provider approves.  If you develop varicose veins, wear support hose. Elevate your feet for 15 minutes, 3-4 times a day. Limit salt in your diet.  Avoid heavy lifting, wear low heel shoes, and practice good posture.  Rest with your legs elevated if you have leg cramps or low back pain.  Visit your dentist if you have not gone yet during your pregnancy. Use a soft toothbrush to brush your teeth and be gentle when you floss.  A sexual relationship may be continued unless your health care provider directs you otherwise.  Continue to go to all your prenatal visits as directed by your health care provider. SEEK MEDICAL CARE IF:   You have dizziness.  You have mild pelvic cramps, pelvic pressure, or nagging pain in the abdominal area.  You have persistent nausea, vomiting, or diarrhea.  You have a bad smelling vaginal discharge.  You have pain with urination. SEEK IMMEDIATE MEDICAL CARE IF:   You have a  fever.  You are leaking fluid from your vagina.  You have spotting or bleeding from your vagina.  You have severe abdominal cramping or pain.  You have rapid weight gain or loss.  You have shortness of breath with chest pain.  You notice sudden or extreme swelling of your face, hands, ankles, feet, or legs.  You have not felt your baby move in over an hour.  You have severe headaches that do not go away with medicine.  You have vision changes. Document Released: 05/24/2001 Document Revised: 06/04/2013 Document Reviewed: 07/31/2012 Exit[employer] Patient Information 2015 [employer]. This information is not intended to replace advice given to   you by your health care provider. Make sure you discuss any questions you have with your health care provider.  

## 2014-10-23 NOTE — Progress Notes (Signed)
States after intercourse noted some pinkish discharge without cramps or contractions.  Does c/o cramps occasionally.

## 2014-10-23 NOTE — Progress Notes (Signed)
Pt is not having any bleeding. She had light pink spotting after sex a week and a half ago. She does describe probable sciatica on her right side. Advised tylenol for pain and heating [pad to back for comfort.

## 2014-11-20 ENCOUNTER — Ambulatory Visit (HOSPITAL_COMMUNITY)
Admission: RE | Admit: 2014-11-20 | Discharge: 2014-11-20 | Disposition: A | Discharge status: Home / Self Care | Payer: Medicaid Other | Source: Ambulatory Visit | Attending: Family Medicine | Admitting: Family Medicine

## 2014-11-20 ENCOUNTER — Ambulatory Visit (INDEPENDENT_AMBULATORY_CARE_PROVIDER_SITE_OTHER): Payer: Medicaid Other | Admitting: Family Medicine

## 2014-11-20 ENCOUNTER — Encounter (HOSPITAL_COMMUNITY): Payer: Self-pay

## 2014-11-20 VITALS — BP 108/69 | HR 75 | Temp 98.9°F | Wt 162.5 lb

## 2014-11-20 DIAGNOSIS — O09292 Supervision of pregnancy with other poor reproductive or obstetric history, second trimester: Secondary | ICD-10-CM | POA: Diagnosis present

## 2014-11-20 DIAGNOSIS — Z3A21 21 weeks gestation of pregnancy: Secondary | ICD-10-CM | POA: Insufficient documentation

## 2014-11-20 DIAGNOSIS — Z3482 Encounter for supervision of other normal pregnancy, second trimester: Secondary | ICD-10-CM

## 2014-11-20 NOTE — Progress Notes (Signed)
Subjective:   Melanie Moore is a 24 y.o. G2P0101 at [redacted]w[redacted]d being seen today for ongoing prenatal care.  Patient reports backache, right buttox pain.  Right buttox pain radiates down right leg, sometimes with some numbness.  Has been doing position changes with some improvement in pain.  Contractions: Not present.  Vag. Bleeding: None, Other. Movement: Present. Denies leaking of fluid.   The following portions of the patient's history were reviewed and updated as appropriate: allergies, current medications, past family history, past medical history, past social history, past surgical history and problem list.   Objective:   Filed Vitals:   11/20/14 1450  BP: 108/69  Pulse: 75  Temp: 98.9 F (37.2 C)  Weight: 162 lb 8 oz (73.71 kg)    Fetal Status: Fetal Heart Rate (bpm): 145   Movement: Present     General:  Alert, oriented and cooperative. Patient is in no acute distress.  Skin: Skin is warm and dry. No rash noted.   Cardiovascular: Normal heart rate noted  Respiratory: Effort and breath sounds normal, no problems with respiration noted  Abdomen: Soft, gravid, appropriate for gestational age. Pain/Pressure: Present     Vaginal: Vag. Bleeding: None, Other.       Cervix: Deferred  Extremities: Normal range of motion.  Edema: Trace  Mental Status: Normal mood and affect. Normal behavior. Normal judgment and thought content.   Urinalysis:       Assessment and Plan:   Pregnancy: G2P0101 at [redacted]w[redacted]d  There are no diagnoses linked to this encounter.  Sciatic nerve pain discussed with patient, will request next appt with Dr. Adrian Blackwater for possible O&M LARC discussed at length, considering Mirena.  Please refer to After Visit Summary for other counseling recommendations.   Return in about 4 weeks (around 12/18/2014).   Fredirick Lathe, MD

## 2014-11-20 NOTE — Patient Instructions (Addendum)
Etonogestrel implant What is this medicine? ETONOGESTREL (et oh noe JES trel) is a contraceptive (birth control) device. It is used to prevent pregnancy. It can be used for up to 3 years. This medicine may be used for other purposes; ask your health care provider or pharmacist if you have questions. COMMON BRAND NAME(S): Implanon, Nexplanon What should I tell my health care provider before I take this medicine? They need to know if you have any of these conditions: -abnormal vaginal bleeding -blood vessel disease or blood clots -cancer of the breast, cervix, or liver -depression -diabetes -gallbladder disease -headaches -heart disease or recent heart attack -high blood pressure -high cholesterol -kidney disease -liver disease -renal disease -seizures -tobacco smoker -an unusual or allergic reaction to etonogestrel, other hormones, anesthetics or antiseptics, medicines, foods, dyes, or preservatives -pregnant or trying to get pregnant -breast-feeding How should I use this medicine? This device is inserted just under the skin on the inner side of your upper arm by a health care professional. Talk to your pediatrician regarding the use of this medicine in children. Special care may be needed. Overdosage: If you think you've taken too much of this medicine contact a poison control center or emergency room at once. Overdosage: If you think you have taken too much of this medicine contact a poison control center or emergency room at once. NOTE: This medicine is only for you. Do not share this medicine with others. What if I miss a dose? This does not apply. What may interact with this medicine? Do not take this medicine with any of the following medications: -amprenavir -bosentan -fosamprenavir This medicine may also interact with the following medications: -barbiturate medicines for inducing sleep or treating seizures -certain medicines for fungal infections like ketoconazole and  itraconazole -griseofulvin -medicines to treat seizures like carbamazepine, felbamate, oxcarbazepine, phenytoin, topiramate -modafinil -phenylbutazone -rifampin -some medicines to treat HIV infection like atazanavir, indinavir, lopinavir, nelfinavir, tipranavir, ritonavir -St. John's wort This list may not describe all possible interactions. Give your health care provider a list of all the medicines, herbs, non-prescription drugs, or dietary supplements you use. Also tell them if you smoke, drink alcohol, or use illegal drugs. Some items may interact with your medicine. What should I watch for while using this medicine? This product does not protect you against HIV infection (AIDS) or other sexually transmitted diseases. You should be able to feel the implant by pressing your fingertips over the skin where it was inserted. Tell your doctor if you cannot feel the implant. What side effects may I notice from receiving this medicine? Side effects that you should report to your doctor or health care professional as soon as possible: -allergic reactions like skin rash, itching or hives, swelling of the face, lips, or tongue -breast lumps -changes in vision -confusion, trouble speaking or understanding -dark urine -depressed mood -general ill feeling or flu-like symptoms -light-colored stools -loss of appetite, nausea -right upper belly pain -severe headaches -severe pain, swelling, or tenderness in the abdomen -shortness of breath, chest pain, swelling in a leg -signs of pregnancy -sudden numbness or weakness of the face, arm or leg -trouble walking, dizziness, loss of balance or coordination -unusual vaginal bleeding, discharge -unusually weak or tired -yellowing of the eyes or skin Side effects that usually do not require medical attention (Report these to your doctor or health care professional if they continue or are bothersome.): -acne -breast pain -changes in  weight -cough -fever or chills -headache -irregular menstrual bleeding -itching, burning, and   vaginal discharge -pain or difficulty passing urine -sore throat This list may not describe all possible side effects. Call your doctor for medical advice about side effects. You may report side effects to FDA at 1-800-FDA-1088. Where should I keep my medicine? This drug is given in a hospital or clinic and will not be stored at home. NOTE: This sheet is a summary. It may not cover all possible information. If you have questions about this medicine, talk to your doctor, pharmacist, or health care provider.  2015, [employer] Standard. (2011-12-05 15:37:45)    Intrauterine Device Information An intrauterine device (IUD) is inserted into your uterus to prevent pregnancy. There are two types of IUDs available:   Copper IUD--This type of IUD is wrapped in copper wire and is placed inside the uterus. Copper makes the uterus and fallopian tubes produce a fluid that kills sperm. The copper IUD can stay in place for 10 years.  Hormone IUD--This type of IUD contains the hormone progestin (synthetic progesterone). The hormone thickens the cervical mucus and prevents sperm from entering the uterus. It also thins the uterine lining to prevent implantation of a fertilized egg. The hormone can weaken or kill the sperm that get into the uterus. One type of hormone IUD can stay in place for 5 years, and another type can stay in place for 3 years. Your health care provider will make sure you are a good candidate for a contraceptive IUD. Discuss with your health care provider the possible side effects.  ADVANTAGES OF AN INTRAUTERINE DEVICE  IUDs are highly effective, reversible, long acting, and low maintenance.   There are no estrogen-related side effects.   An IUD can be used when breastfeeding.   IUDs are not associated with weight gain.   The copper IUD works immediately after insertion.   The  hormone IUD works right away if inserted within 7 days of your period starting. You will need to use a backup method of birth control for 7 days if the hormone IUD is inserted at any other time in your cycle.  The copper IUD does not interfere with your female hormones.   The hormone IUD can make heavy menstrual periods lighter and decrease cramping.   The hormone IUD can be used for 3 or 5 years.   The copper IUD can be used for 10 years. DISADVANTAGES OF AN INTRAUTERINE DEVICE  The hormone IUD can be associated with irregular bleeding patterns.   The copper IUD can make your menstrual flow heavier and more painful.   You may experience cramping and vaginal bleeding after insertion.  Document Released: 05/03/2004 Document Revised: 01/30/2013 Document Reviewed: 11/18/2012 [facility] Patient Information 2015 [place], [place]. This information is not intended to replace advice given to you by your health care provider. Make sure you discuss any questions you have with your health care provider.   Sciatica with Rehab The sciatic nerve runs from the back down the leg and is responsible for sensation and control of the muscles in the back (posterior) side of the thigh, lower leg, and foot. Sciatica is a condition that is characterized by inflammation of this nerve.  SYMPTOMS   Signs of nerve damage, including numbness and/or weakness along the posterior side of the lower extremity.  Pain in the back of the thigh that may also travel down the leg.  Pain that worsens when sitting for long periods of time.  Occasionally, pain in the back or buttock. CAUSES  Inflammation of the sciatic nerve  is the cause of sciatica. The inflammation is due to something irritating the nerve. Common sources of irritation include:  Sitting for long periods of time.  Direct trauma to the nerve.  Arthritis of the spine.  Herniated or ruptured disk.  Slipping of the vertebrae  (spondylolisthesis).  Pressure from soft tissues, such as muscles or ligament-like tissue (fascia). RISK INCREASES WITH:  Sports that place pressure or stress on the spine (football or weightlifting).  Poor strength and flexibility.  Failure to warm up properly before activity.  Family history of low back pain or disk disorders.  Previous back injury or surgery.  Poor body mechanics, especially when lifting, or poor posture. PREVENTION   Warm up and stretch properly before activity.  Maintain physical fitness:  Strength, flexibility, and endurance.  Cardiovascular fitness.  Learn and use proper technique, especially with posture and lifting. When possible, have coach correct improper technique.  Avoid activities that place stress on the spine. PROGNOSIS If treated properly, then sciatica usually resolves within 6 weeks. However, occasionally surgery is necessary.  RELATED COMPLICATIONS   Permanent nerve damage, including pain, numbness, tingle, or weakness.  Chronic back pain.  Risks of surgery: infection, bleeding, nerve damage, or damage to surrounding tissues. TREATMENT Treatment initially involves resting from any activities that aggravate your symptoms. The use of ice and medication may help reduce pain and inflammation. The use of strengthening and stretching exercises may help reduce pain with activity. These exercises may be performed at home or with referral to a therapist. A therapist may recommend further treatments, such as transcutaneous electronic nerve stimulation (TENS) or ultrasound. Your caregiver may recommend corticosteroid injections to help reduce inflammation of the sciatic nerve. If symptoms persist despite non-surgical (conservative) treatment, then surgery may be recommended. MEDICATION  If pain medication is necessary, then nonsteroidal anti-inflammatory medications, such as aspirin and ibuprofen, or other minor pain relievers, such as  acetaminophen, are often recommended.  Do not take pain medication for 7 days before surgery.  Prescription pain relievers may be given if deemed necessary by your caregiver. Use only as directed and only as much as you need.  Ointments applied to the skin may be helpful.  Corticosteroid injections may be given by your caregiver. These injections should be reserved for the most serious cases, because they may only be given a certain number of times. HEAT AND COLD  Cold treatment (icing) relieves pain and reduces inflammation. Cold treatment should be applied for 10 to 15 minutes every 2 to 3 hours for inflammation and pain and immediately after any activity that aggravates your symptoms. Use ice packs or massage the area with a piece of ice (ice massage).  Heat treatment may be used prior to performing the stretching and strengthening activities prescribed by your caregiver, physical therapist, or athletic trainer. Use a heat pack or soak the injury in warm water. SEEK MEDICAL CARE IF:  Treatment seems to offer no benefit, or the condition worsens.  Any medications produce adverse side effects. EXERCISES  RANGE OF MOTION (ROM) AND STRETCHING EXERCISES - Sciatica Most people with sciatic will find that their symptoms worsen with either excessive bending forward (flexion) or arching at the low back (extension). The exercises which will help resolve your symptoms will focus on the opposite motion. Your physician, physical therapist or athletic trainer will help you determine which exercises will be most helpful to resolve your low back pain. Do not complete any exercises without first consulting with your clinician. Discontinue any exercises  which worsen your symptoms until you speak to your clinician. If you have pain, numbness or tingling which travels down into your buttocks, leg or foot, the goal of the therapy is for these symptoms to move closer to your back and eventually resolve.  Occasionally, these leg symptoms will get better, but your low back pain may worsen; this is typically an indication of progress in your rehabilitation. Be certain to be very alert to any changes in your symptoms and the activities in which you participated in the 24 hours prior to the change. Sharing this information with your clinician will allow him/her to most efficiently treat your condition. These exercises may help you when beginning to rehabilitate your injury. Your symptoms may resolve with or without further involvement from your physician, physical therapist or athletic trainer. While completing these exercises, remember:   Restoring tissue flexibility helps normal motion to return to the joints. This allows healthier, less painful movement and activity.  An effective stretch should be held for at least 30 seconds.  A stretch should never be painful. You should only feel a gentle lengthening or release in the stretched tissue. FLEXION RANGE OF MOTION AND STRETCHING EXERCISES: STRETCH - Flexion, Single Knee to Chest   Lie on a firm bed or floor with both legs extended in front of you.  Keeping one leg in contact with the floor, bring your opposite knee to your chest. Hold your leg in place by either grabbing behind your thigh or at your knee.  Pull until you feel a gentle stretch in your low back. Hold __________ seconds.  Slowly release your grasp and repeat the exercise with the opposite side. Repeat __________ times. Complete this exercise __________ times per day.  STRETCH - Flexion, Double Knee to Chest  Lie on a firm bed or floor with both legs extended in front of you.  Keeping one leg in contact with the floor, bring your opposite knee to your chest.  Tense your stomach muscles to support your back and then lift your other knee to your chest. Hold your legs in place by either grabbing behind your thighs or at your knees.  Pull both knees toward your chest until you feel a  gentle stretch in your low back. Hold __________ seconds.  Tense your stomach muscles and slowly return one leg at a time to the floor. Repeat __________ times. Complete this exercise __________ times per day.  STRETCH - Low Trunk Rotation   Lie on a firm bed or floor. Keeping your legs in front of you, bend your knees so they are both pointed toward the ceiling and your feet are flat on the floor.  Extend your arms out to the side. This will stabilize your upper body by keeping your shoulders in contact with the floor.  Gently and slowly drop both knees together to one side until you feel a gentle stretch in your low back. Hold for __________ seconds.  Tense your stomach muscles to support your low back as you bring your knees back to the starting position. Repeat the exercise to the other side. Repeat __________ times. Complete this exercise __________ times per day  EXTENSION RANGE OF MOTION AND FLEXIBILITY EXERCISES: STRETCH - Extension, Prone on Elbows  Lie on your stomach on the floor, a bed will be too soft. Place your palms about shoulder width apart and at the height of your head.  Place your elbows under your shoulders. If this is too painful, stack pillows under  your chest.  Allow your body to relax so that your hips drop lower and make contact more completely with the floor.  Hold this position for __________ seconds.  Slowly return to lying flat on the floor. Repeat __________ times. Complete this exercise __________ times per day.  RANGE OF MOTION - Extension, Prone Press Ups  Lie on your stomach on the floor, a bed will be too soft. Place your palms about shoulder width apart and at the height of your head.  Keeping your back as relaxed as possible, slowly straighten your elbows while keeping your hips on the floor. You may adjust the placement of your hands to maximize your comfort. As you gain motion, your hands will come more underneath your shoulders.  Hold this  position __________ seconds.  Slowly return to lying flat on the floor. Repeat __________ times. Complete this exercise __________ times per day.  STRENGTHENING EXERCISES - Sciatica  These exercises may help you when beginning to rehabilitate your injury. These exercises should be done near your "sweet spot." This is the neutral, low-back arch, somewhere between fully rounded and fully arched, that is your least painful position. When performed in this safe range of motion, these exercises can be used for people who have either a flexion or extension based injury. These exercises may resolve your symptoms with or without further involvement from your physician, physical therapist or athletic trainer. While completing these exercises, remember:   Muscles can gain both the endurance and the strength needed for everyday activities through controlled exercises.  Complete these exercises as instructed by your physician, physical therapist or athletic trainer. Progress with the resistance and repetition exercises only as your caregiver advises.  You may experience muscle soreness or fatigue, but the pain or discomfort you are trying to eliminate should never worsen during these exercises. If this pain does worsen, stop and make certain you are following the directions exactly. If the pain is still present after adjustments, discontinue the exercise until you can discuss the trouble with your clinician. STRENGTHENING - Deep Abdominals, Pelvic Tilt   Lie on a firm bed or floor. Keeping your legs in front of you, bend your knees so they are both pointed toward the ceiling and your feet are flat on the floor.  Tense your lower abdominal muscles to press your low back into the floor. This motion will rotate your pelvis so that your tail bone is scooping upwards rather than pointing at your feet or into the floor.  With a gentle tension and even breathing, hold this position for __________ seconds. Repeat  __________ times. Complete this exercise __________ times per day.  STRENGTHENING - Abdominals, Crunches   Lie on a firm bed or floor. Keeping your legs in front of you, bend your knees so they are both pointed toward the ceiling and your feet are flat on the floor. Cross your arms over your chest.  Slightly tip your chin down without bending your neck.  Tense your abdominals and slowly lift your trunk high enough to just clear your shoulder blades. Lifting higher can put excessive stress on the low back and does not further strengthen your abdominal muscles.  Control your return to the starting position. Repeat __________ times. Complete this exercise __________ times per day.  STRENGTHENING - Quadruped, Opposite UE/LE Lift  Assume a hands and knees position on a firm surface. Keep your hands under your shoulders and your knees under your hips. You may place padding under your knees  for comfort.  Find your neutral spine and gently tense your abdominal muscles so that you can maintain this position. Your shoulders and hips should form a rectangle that is parallel with the floor and is not twisted.  Keeping your trunk steady, lift your right hand no higher than your shoulder and then your left leg no higher than your hip. Make sure you are not holding your breath. Hold this position __________ seconds.  Continuing to keep your abdominal muscles tense and your back steady, slowly return to your starting position. Repeat with the opposite arm and leg. Repeat __________ times. Complete this exercise __________ times per day.  STRENGTHENING - Abdominals and Quadriceps, Straight Leg Raise   Lie on a firm bed or floor with both legs extended in front of you.  Keeping one leg in contact with the floor, bend the other knee so that your foot can rest flat on the floor.  Find your neutral spine, and tense your abdominal muscles to maintain your spinal position throughout the exercise.  Slowly lift  your straight leg off the floor about 6 inches for a count of 15, making sure to not hold your breath.  Still keeping your neutral spine, slowly lower your leg all the way to the floor. Repeat this exercise with each leg __________ times. Complete this exercise __________ times per day. POSTURE AND BODY MECHANICS CONSIDERATIONS - Sciatica Keeping correct posture when sitting, standing or completing your activities will reduce the stress put on different body tissues, allowing injured tissues a chance to heal and limiting painful experiences. The following are general guidelines for improved posture. Your physician or physical therapist will provide you with any instructions specific to your needs. While reading these guidelines, remember:  The exercises prescribed by your provider will help you have the flexibility and strength to maintain correct postures.  The correct posture provides the optimal environment for your joints to work. All of your joints have less wear and tear when properly supported by a spine with good posture. This means you will experience a healthier, less painful body.  Correct posture must be practiced with all of your activities, especially prolonged sitting and standing. Correct posture is as important when doing repetitive low-stress activities (typing) as it is when doing a single heavy-load activity (lifting). RESTING POSITIONS Consider which positions are most painful for you when choosing a resting position. If you have pain with flexion-based activities (sitting, bending, stooping, squatting), choose a position that allows you to rest in a less flexed posture. You would want to avoid curling into a fetal position on your side. If your pain worsens with extension-based activities (prolonged standing, working overhead), avoid resting in an extended position such as sleeping on your stomach. Most people will find more comfort when they rest with their spine in a more neutral  position, neither too rounded nor too arched. Lying on a non-sagging bed on your side with a pillow between your knees, or on your back with a pillow under your knees will often provide some relief. Keep in mind, being in any one position for a prolonged period of time, no matter how correct your posture, can still lead to stiffness. PROPER SITTING POSTURE In order to minimize stress and discomfort on your spine, you must sit with correct posture Sitting with good posture should be effortless for a healthy body. Returning to good posture is a gradual process. Many people can work toward this most comfortably by using various supports until they  have the flexibility and strength to maintain this posture on their own. When sitting with proper posture, your ears will fall over your shoulders and your shoulders will fall over your hips. You should use the back of the chair to support your upper back. Your low back will be in a neutral position, just slightly arched. You may place a small pillow or folded towel at the base of your low back for support.  When working at a desk, create an environment that supports good, upright posture. Without extra support, muscles fatigue and lead to excessive strain on joints and other tissues. Keep these recommendations in mind: CHAIR:   A chair should be able to slide under your desk when your back makes contact with the back of the chair. This allows you to work closely.  The chair's height should allow your eyes to be level with the upper part of your monitor and your hands to be slightly lower than your elbows. BODY POSITION  Your feet should make contact with the floor. If this is not possible, use a foot rest.  Keep your ears over your shoulders. This will reduce stress on your neck and low back. INCORRECT SITTING POSTURES   If you are feeling tired and unable to assume a healthy sitting posture, do not slouch or slump. This puts excessive strain on your back  tissues, causing more damage and pain. Healthier options include:  Using more support, like a lumbar pillow.  Switching tasks to something that requires you to be upright or walking.  Talking a brief walk.  Lying down to rest in a neutral-spine position. PROLONGED STANDING WHILE SLIGHTLY LEANING FORWARD  When completing a task that requires you to lean forward while standing in one place for a long time, place either foot up on a stationary 2-4 inch high object to help maintain the best posture. When both feet are on the ground, the low back tends to lose its slight inward curve. If this curve flattens (or becomes too large), then the back and your other joints will experience too much stress, fatigue more quickly and can cause pain.  CORRECT STANDING POSTURES Proper standing posture should be assumed with all daily activities, even if they only take a few moments, like when brushing your teeth. As in sitting, your ears should fall over your shoulders and your shoulders should fall over your hips. You should keep a slight tension in your abdominal muscles to brace your spine. Your tailbone should point down to the ground, not behind your body, resulting in an over-extended swayback posture.  INCORRECT STANDING POSTURES  Common incorrect standing postures include a forward head, locked knees and/or an excessive swayback. WALKING Walk with an upright posture. Your ears, shoulders and hips should all line-up. PROLONGED ACTIVITY IN A FLEXED POSITION When completing a task that requires you to bend forward at your waist or lean over a low surface, try to find a way to stabilize 3 of 4 of your limbs. You can place a hand or elbow on your thigh or rest a knee on the surface you are reaching across. This will provide you more stability so that your muscles do not fatigue as quickly. By keeping your knees relaxed, or slightly bent, you will also reduce stress across your low back. CORRECT LIFTING  TECHNIQUES DO :   Assume a wide stance. This will provide you more stability and the opportunity to get as close as possible to the object which you are  lifting.  Tense your abdominals to brace your spine; then bend at the knees and hips. Keeping your back locked in a neutral-spine position, lift using your leg muscles. Lift with your legs, keeping your back straight.  Test the weight of unknown objects before attempting to lift them.  Try to keep your elbows locked down at your sides in order get the best strength from your shoulders when carrying an object.  Always ask for help when lifting heavy or awkward objects. INCORRECT LIFTING TECHNIQUES DO NOT:   Lock your knees when lifting, even if it is a small object.  Bend and twist. Pivot at your feet or move your feet when needing to change directions.  Assume that you cannot safely pick up a paperclip without proper posture. Document Released: 05/30/2005 Document Revised: 10/14/2013 Document Reviewed: 09/11/2008 [facility] Patient Information 2015 [place], [place]. This information is not intended to replace advice given to you by your health care provider. Make sure you discuss any questions you have with your health care provider.

## 2014-11-20 NOTE — Progress Notes (Signed)
Edema- feet   Pain-back, leg   Pressure- movement of baby  Vaginal spotting when went to restroom

## 2014-11-21 LAB — POCT URINALYSIS DIP (DEVICE)
Bilirubin Urine: NEGATIVE
Glucose, UA: NEGATIVE mg/dL
Hgb urine dipstick: NEGATIVE
Ketones, ur: NEGATIVE mg/dL
NITRITE: NEGATIVE
PH: 6 (ref 5.0–8.0)
Protein, ur: NEGATIVE mg/dL
Specific Gravity, Urine: 1.025 (ref 1.005–1.030)
UROBILINOGEN UA: 0.2 mg/dL (ref 0.0–1.0)

## 2014-12-18 ENCOUNTER — Ambulatory Visit (INDEPENDENT_AMBULATORY_CARE_PROVIDER_SITE_OTHER): Payer: Medicaid Other | Admitting: Family Medicine

## 2014-12-18 VITALS — BP 112/62 | HR 86 | Temp 98.5°F | Wt 169.9 lb

## 2014-12-18 DIAGNOSIS — O9989 Other specified diseases and conditions complicating pregnancy, childbirth and the puerperium: Secondary | ICD-10-CM

## 2014-12-18 DIAGNOSIS — Z3482 Encounter for supervision of other normal pregnancy, second trimester: Secondary | ICD-10-CM | POA: Diagnosis not present

## 2014-12-18 DIAGNOSIS — M549 Dorsalgia, unspecified: Secondary | ICD-10-CM

## 2014-12-18 DIAGNOSIS — O26892 Other specified pregnancy related conditions, second trimester: Secondary | ICD-10-CM

## 2014-12-18 DIAGNOSIS — M9909 Segmental and somatic dysfunction of abdomen and other regions: Secondary | ICD-10-CM | POA: Diagnosis not present

## 2014-12-18 LAB — POCT URINALYSIS DIP (DEVICE)
Bilirubin Urine: NEGATIVE
Glucose, UA: NEGATIVE mg/dL
Hgb urine dipstick: NEGATIVE
Ketones, ur: NEGATIVE mg/dL
Nitrite: NEGATIVE
Protein, ur: NEGATIVE mg/dL
Specific Gravity, Urine: 1.02 (ref 1.005–1.030)
Urobilinogen, UA: 0.2 mg/dL (ref 0.0–1.0)
pH: 6.5 (ref 5.0–8.0)

## 2014-12-18 NOTE — Progress Notes (Signed)
Breastfeeding tip of the week reviewed. 

## 2014-12-18 NOTE — Progress Notes (Signed)
Subjective:  Melanie LloydMilka Moore is a 24 y.o. G2P0101 at 7567w5d being seen today for ongoing prenatal care.  Patient reports right lower backache.  Back pain intermittent for several weeks.  Gets on hands and knees, which is helpful.  Occasionally radiates down right leg Contractions: Not present.  Vag. Bleeding: None. Movement: Present. Denies leaking of fluid.   The following portions of the patient's history were reviewed and updated as appropriate: allergies, current medications, past family history, past medical history, past social history, past surgical history and problem list.   Objective:   Filed Vitals:   12/18/14 1113  BP: 112/62  Pulse: 86  Temp: 98.5 F (36.9 C)  Weight: 169 lb 14.4 oz (77.066 kg)    Fetal Status: Fetal Heart Rate (bpm): 140   Movement: Present     General:  Alert, oriented and cooperative. Patient is in no acute distress.  Skin: Skin is warm and dry. No rash noted.   Cardiovascular: Normal heart rate noted  Respiratory: Normal respiratory effort, no problems with respiration noted  Abdomen: Soft, gravid, appropriate for gestational age. Pain/Pressure: Present     Vaginal: Vag. Bleeding: None.    Vag D/C Character: White  Cervix: Not evaluated        MSK: Tenderness to right lower back, normal range of motion  OSE: L5 ESRR, L/L sacral torsion, right anterior innom  Extremities: Normal range of motion.  Edema: Trace  Mental Status: Normal mood and affect. Normal behavior. Normal judgment and thought content.   Urinalysis: Urine Protein: Negative Urine Glucose: Negative  Assessment and Plan:  Pregnancy: G2P0101 at 3667w5d  1. Supervision of other normal pregnancy, antepartum, second trimester Normal FHT, fundal height  2. Back pain in pregnancy OMT done, pelvic isolytic stretches shown  3. Somatic dysfunction OMT - HVLA technique done.  Pt tolerated well.   Preterm labor symptoms and general obstetric precautions including but not limited to vaginal  bleeding, contractions, leaking of fluid and fetal movement were reviewed in detail with the patient.  Please refer to After Visit Summary for other counseling recommendations.   Return in about 4 weeks (around 01/15/2015).   Levie HeritageJacob J Jamorian Dimaria, DO

## 2014-12-18 NOTE — Patient Instructions (Signed)
Second Trimester of Pregnancy The second trimester is from week 13 through week 28, months 4 through 6. The second trimester is often a time when you feel your best. Your body has also adjusted to being pregnant, and you begin to feel better physically. Usually, morning sickness has lessened or quit completely, you may have more energy, and you may have an increase in appetite. The second trimester is also a time when the fetus is growing rapidly. At the end of the sixth month, the fetus is about 9 inches long and weighs about 1 pounds. You will likely begin to feel the baby move (quickening) between 18 and 20 weeks of the pregnancy. BODY CHANGES Your body goes through many changes during pregnancy. The changes vary from woman to woman.   Your weight will continue to increase. You will notice your lower abdomen bulging out.  You may begin to get stretch marks on your hips, abdomen, and breasts.  You may develop headaches that can be relieved by medicines approved by your health care provider.  You may urinate more often because the fetus is pressing on your bladder.  You may develop or continue to have heartburn as a result of your pregnancy.  You may develop constipation because certain hormones are causing the muscles that push waste through your intestines to slow down.  You may develop hemorrhoids or swollen, bulging veins (varicose veins).  You may have back pain because of the weight gain and pregnancy hormones relaxing your joints between the bones in your pelvis and as a result of a shift in weight and the muscles that support your balance.  Your breasts will continue to grow and be tender.  Your gums may bleed and may be sensitive to brushing and flossing.  Dark spots or blotches (chloasma, mask of pregnancy) may develop on your face. This will likely fade after the baby is born.  A dark line from your belly button to the pubic area (linea nigra) may appear. This will likely fade  after the baby is born.  You may have changes in your hair. These can include thickening of your hair, rapid growth, and changes in texture. Some women also have hair loss during or after pregnancy, or hair that feels dry or thin. Your hair will most likely return to normal after your baby is born. WHAT TO EXPECT AT YOUR PRENATAL VISITS During a routine prenatal visit:  You will be weighed to make sure you and the fetus are growing normally.  Your blood pressure will be taken.  Your abdomen will be measured to track your baby's growth.  The fetal heartbeat will be listened to.  Any test results from the previous visit will be discussed. Your health care provider may ask you:  How you are feeling.  If you are feeling the baby move.  If you have had any abnormal symptoms, such as leaking fluid, bleeding, severe headaches, or abdominal cramping.  If you have any questions. Other tests that may be performed during your second trimester include:  Blood tests that check for:  Low iron levels (anemia).  Gestational diabetes (between 24 and 28 weeks).  Rh antibodies.  Urine tests to check for infections, diabetes, or protein in the urine.  An ultrasound to confirm the proper growth and development of the baby.  An amniocentesis to check for possible genetic problems.  Fetal screens for spina bifida and Down syndrome. HOME CARE INSTRUCTIONS   Avoid all smoking, herbs, alcohol, and unprescribed   drugs. These chemicals affect the formation and growth of the baby.  Follow your health care provider's instructions regarding medicine use. There are medicines that are either safe or unsafe to take during pregnancy.  Exercise only as directed by your health care provider. Experiencing uterine cramps is a good sign to stop exercising.  Continue to eat regular, healthy meals.  Wear a good support bra for breast tenderness.  Do not use hot tubs, steam rooms, or saunas.  Wear your  seat belt at all times when driving.  Avoid raw meat, uncooked cheese, cat litter boxes, and soil used by cats. These carry germs that can cause birth defects in the baby.  Take your prenatal vitamins.  Try taking a stool softener (if your health care provider approves) if you develop constipation. Eat more high-fiber foods, such as fresh vegetables or fruit and whole grains. Drink plenty of fluids to keep your urine clear or pale yellow.  Take warm sitz baths to soothe any pain or discomfort caused by hemorrhoids. Use hemorrhoid cream if your health care provider approves.  If you develop varicose veins, wear support hose. Elevate your feet for 15 minutes, 3-4 times a day. Limit salt in your diet.  Avoid heavy lifting, wear low heel shoes, and practice good posture.  Rest with your legs elevated if you have leg cramps or low back pain.  Visit your dentist if you have not gone yet during your pregnancy. Use a soft toothbrush to brush your teeth and be gentle when you floss.  A sexual relationship may be continued unless your health care provider directs you otherwise.  Continue to go to all your prenatal visits as directed by your health care provider. SEEK MEDICAL CARE IF:   You have dizziness.  You have mild pelvic cramps, pelvic pressure, or nagging pain in the abdominal area.  You have persistent nausea, vomiting, or diarrhea.  You have a bad smelling vaginal discharge.  You have pain with urination. SEEK IMMEDIATE MEDICAL CARE IF:   You have a fever.  You are leaking fluid from your vagina.  You have spotting or bleeding from your vagina.  You have severe abdominal cramping or pain.  You have rapid weight gain or loss.  You have shortness of breath with chest pain.  You notice sudden or extreme swelling of your face, hands, ankles, feet, or legs.  You have not felt your baby move in over an hour.  You have severe headaches that do not go away with  medicine.  You have vision changes. Document Released: 05/24/2001 Document Revised: 06/04/2013 Document Reviewed: 07/31/2012 Exit[employer] Patient Information 2015 [employer]. This information is not intended to replace advice given to you by your health care provider. Make sure you discuss any questions you have with your health care provider.  

## 2014-12-22 ENCOUNTER — Encounter: Payer: Self-pay | Admitting: *Deleted

## 2014-12-22 NOTE — Progress Notes (Signed)
FMLA completed for FOB, copied and placed in box for patient to pick up at next appointment.

## 2015-01-15 ENCOUNTER — Ambulatory Visit (INDEPENDENT_AMBULATORY_CARE_PROVIDER_SITE_OTHER): Payer: Medicaid Other | Admitting: Obstetrics & Gynecology

## 2015-01-15 VITALS — BP 112/67 | HR 85 | Temp 97.9°F | Wt 169.4 lb

## 2015-01-15 DIAGNOSIS — Z3483 Encounter for supervision of other normal pregnancy, third trimester: Secondary | ICD-10-CM

## 2015-01-15 DIAGNOSIS — Z23 Encounter for immunization: Secondary | ICD-10-CM | POA: Diagnosis not present

## 2015-01-15 LAB — POCT URINALYSIS DIP (DEVICE)
BILIRUBIN URINE: NEGATIVE
Glucose, UA: NEGATIVE mg/dL
Hgb urine dipstick: NEGATIVE
Ketones, ur: NEGATIVE mg/dL
Nitrite: NEGATIVE
PH: 6 (ref 5.0–8.0)
Protein, ur: NEGATIVE mg/dL
Specific Gravity, Urine: >=1.03 (ref 1.005–1.030)
Urobilinogen, UA: 0.2 mg/dL (ref 0.0–1.0)

## 2015-01-15 LAB — CBC
HCT: 37.2 % (ref 36.0–46.0)
Hemoglobin: 12.3 g/dL (ref 12.0–15.0)
MCH: 29.6 pg (ref 26.0–34.0)
MCHC: 33.1 g/dL (ref 30.0–36.0)
MCV: 89.6 fL (ref 78.0–100.0)
MPV: 10.1 fL (ref 8.6–12.4)
Platelets: 202 10*3/uL (ref 150–400)
RBC: 4.15 MIL/uL (ref 3.87–5.11)
RDW: 14 % (ref 11.5–15.5)
WBC: 8.7 10*3/uL (ref 4.0–10.5)

## 2015-01-15 MED ORDER — TETANUS-DIPHTH-ACELL PERTUSSIS 5-2.5-18.5 LF-MCG/0.5 IM SUSP: 0.5000 mL | Freq: Once | INTRAMUSCULAR | Status: DC

## 2015-01-15 MED ORDER — TETANUS-DIPHTH-ACELL PERTUSSIS 5-2.5-18.5 LF-MCG/0.5 IM SUSP
0.5000 mL | Freq: Once | INTRAMUSCULAR | Status: AC
  Administered 2015-01-15: 0.5 mL via INTRAMUSCULAR

## 2015-01-15 NOTE — Patient Instructions (Signed)
Return to clinic for any obstetric concerns or go to MAU for evaluation  

## 2015-01-15 NOTE — Progress Notes (Signed)
Subjective:  Sephira Zellman is a 24 y.o. G2P0101 at [redacted]w[redacted]d being seen today for ongoing prenatal care.  Patient reports no complaints.  Contractions: Not present.  Vag. Bleeding: None. Movement: Present. Denies leaking of fluid.   The following portions of the patient's history were reviewed and updated as appropriate: allergies, current medications, past family history, past medical history, past social history, past surgical history and problem list.   Objective:   Filed Vitals:   01/15/15 0921  BP: 112/67  Pulse: 85  Temp: 97.9 F (36.6 C)  Weight: 169 lb 6.4 oz (76.839 kg)    Fetal Status: Fetal Heart Rate (bpm): 136 Fundal Height: 29 cm Movement: Present     General:  Alert, oriented and cooperative. Patient is in no acute distress.  Skin: Skin is warm and dry. No rash noted.   Cardiovascular: Normal heart rate noted  Respiratory: Normal respiratory effort, no problems with respiration noted  Abdomen: Soft, gravid, appropriate for gestational age. Pain/Pressure: Present     Vaginal: Vag. Bleeding: None.    Vag D/C Character: White  Cervix: Not evaluated        Extremities: Normal range of motion.  Edema: Trace  Mental Status: Normal mood and affect. Normal behavior. Normal judgment and thought content.   Urinalysis: Urine Protein: Negative Urine Glucose: Negative  Assessment and Plan:  Pregnancy: G2P0101 at [redacted]w[redacted]d  Supervision of other normal pregnancy, antepartum, third trimester - CBC - RPR - HIV antibody (with reflex) - Glucose Tolerance, 1 HR (50g) w/o Fasting - Tdap (BOOSTRIX) injection 0.5 mL; Inject 0.5 mLs into the muscle once.  Preterm labor symptoms and general obstetric precautions including but not limited to vaginal bleeding, contractions, leaking of fluid and fetal movement were reviewed in detail with the patient. Please refer to After Visit Summary for other counseling recommendations.  Return in about 2 weeks (around 01/29/2015) for OB Visit.   Tereso Newcomer, MD

## 2015-01-16 LAB — HIV ANTIBODY (ROUTINE TESTING W REFLEX): HIV: NONREACTIVE

## 2015-01-16 LAB — GLUCOSE TOLERANCE, 1 HOUR (50G) W/O FASTING: GLUCOSE 1 HOUR GTT: 149 mg/dL — AB (ref 70–140)

## 2015-01-16 LAB — RPR

## 2015-01-20 ENCOUNTER — Telehealth: Payer: Self-pay | Admitting: Obstetrics and Gynecology

## 2015-01-20 ENCOUNTER — Inpatient Hospital Stay (HOSPITAL_COMMUNITY)
Admission: AD | Admit: 2015-01-20 | Discharge: 2015-01-20 | Disposition: A | Discharge status: Home / Self Care | Payer: Medicaid Other | Source: Ambulatory Visit | Attending: Family Medicine | Admitting: Family Medicine

## 2015-01-20 ENCOUNTER — Inpatient Hospital Stay (HOSPITAL_COMMUNITY): Payer: Medicaid Other

## 2015-01-20 ENCOUNTER — Encounter (HOSPITAL_COMMUNITY): Payer: Self-pay | Admitting: *Deleted

## 2015-01-20 DIAGNOSIS — O4693 Antepartum hemorrhage, unspecified, third trimester: Secondary | ICD-10-CM | POA: Diagnosis not present

## 2015-01-20 DIAGNOSIS — N760 Acute vaginitis: Secondary | ICD-10-CM

## 2015-01-20 DIAGNOSIS — Z3A30 30 weeks gestation of pregnancy: Secondary | ICD-10-CM | POA: Insufficient documentation

## 2015-01-20 DIAGNOSIS — O352XX0 Maternal care for (suspected) hereditary disease in fetus, not applicable or unspecified: Secondary | ICD-10-CM

## 2015-01-20 DIAGNOSIS — B9689 Other specified bacterial agents as the cause of diseases classified elsewhere: Secondary | ICD-10-CM

## 2015-01-20 DIAGNOSIS — O4690 Antepartum hemorrhage, unspecified, unspecified trimester: Secondary | ICD-10-CM

## 2015-01-20 LAB — URINE MICROSCOPIC-ADD ON

## 2015-01-20 LAB — WET PREP, GENITAL
Trich, Wet Prep: NONE SEEN
YEAST WET PREP: NONE SEEN

## 2015-01-20 LAB — URINALYSIS, ROUTINE W REFLEX MICROSCOPIC
BILIRUBIN URINE: NEGATIVE
Glucose, UA: NEGATIVE mg/dL
Ketones, ur: NEGATIVE mg/dL
NITRITE: NEGATIVE
PH: 6 (ref 5.0–8.0)
Protein, ur: NEGATIVE mg/dL
SPECIFIC GRAVITY, URINE: 1.015 (ref 1.005–1.030)
Urobilinogen, UA: 0.2 mg/dL (ref 0.0–1.0)

## 2015-01-20 MED ORDER — METRONIDAZOLE 500 MG PO TABS: 500.0000 mg | ORAL_TABLET | Freq: Two times a day (BID) | ORAL | Status: DC

## 2015-01-20 NOTE — MAU Provider Note (Signed)
Consulted at request of NP Venia Carbon. Refer to her note for full HPI and PE.   Assessment:  Melanie Moore is a 24 y.o. G2P0101 at [redacted]w[redacted]d who presented with vaginal bleeding this AM with 3-4 quarter sized clots that has since diminished to spotting. She is reporting mild contractions that have been present throughout pregnancy. No cervical changes on PE. +FM, denies any fluid or discharge. NST was reactive and 3 UC were noted on toco over period of 5-6 hours.   Discussed with patient the option of admission for observation of bleeding. Stressed that the safest option was to admit for observation of bleeding and FWB. Patient and FOB were resistant to admission and wished to return home.   Sono: cephalic presentation with posterior placenta, above the cervical os. AFI for gestational age was appropriate. No previa or clear sonographic evidence of abruption.   Plan:  #Due to reassuring sono, low amounts of VB on cervical exam, lack of cervical change, and lack of regular uterine contractions,  feel that it is safe for patient to return to home at present.  #Wet mount shows + clue cells indicative of BV that could have caused cervicitis and bleeding. Will start her on Flagyl.  #Will culture urine to r/o UTI and obtain GC/Chlamydia to r/o other infectious etiologies.  #Provided strict return percautions. Encouraged patient to follow up this week with OB on vaginal bleeding and failed 1hr GTT. Patient agreed to perform daily fetal counts until f/u appt.    Marcy Siren, D.O. 01/20/2015, 12:27 PM PGY-1, [facility]

## 2015-01-20 NOTE — MAU Provider Note (Signed)
History     CSN: [id number]  Arrival date and time: 01/20/15 7829   First Provider Initiated Contact with Patient 01/20/15 [phone]      Chief Complaint  Patient presents with  . Vaginal Bleeding   HPI  Melanie Moore is a 23 y.o. female G2P0101 at [redacted]w[redacted]d presenting to MAU with vaginal bleeding. She started bleeding this morning at 0500; scant amount of spotting then increased to bleeding with clotts. No history of placenta previa in this pregnancy, this is the first episode of bleeding with this pregnancy.  No recent intercourse + straining with BM yesterday morning.    + light cramping, currently rates her pain 1/10 + fetal movements.    OB History    Gravida Para Term Preterm AB TAB SAB Ectopic Multiple Living   Past Medical History  Diagnosis Date  . Medical history non-contributory   . LGSIL (low grade squamous intraepithelial lesion) on Pap smear 11/19/2012    Repeat pap in one year as per ASCCP guidelines    Past Surgical History  Procedure Laterality Date  . Eye surgery  2008    sty removed both eyes    Family History  Problem Relation Age of Onset  . Diabetes Father   . Hyperlipidemia Father   . Diabetes Maternal Grandfather   . Diabetes Paternal Grandmother   . Diabetes Paternal Grandfather   . Depression Mother   . Fibromyalgia Mother     History  Substance Use Topics  . Smoking status: Never Smoker   . Smokeless tobacco: Never Used  . Alcohol Use: No     Comment: socially before pregnancy    Allergies: No Known Allergies  Prescriptions prior to admission  Medication Sig Dispense Refill Last Dose  . Prenatal Vit-Fe Fumarate-FA (PRENATAL MULTIVITAMIN) TABS Take 1 tablet by mouth at bedtime.   Taking   Results for orders placed or performed during the hospital encounter of 01/20/15 (from the past 48 hour(s))  Urinalysis, Routine w reflex microscopic (not at [facility])     Status: Abnormal   Collection Time: 01/20/15  6:54 AM   Result Value Ref Range   Color, Urine YELLOW YELLOW   APPearance CLEAR CLEAR   Specific Gravity, Urine 1.015 1.005 - 1.030   pH 6.0 5.0 - 8.0   Glucose, UA NEGATIVE NEGATIVE mg/dL   Hgb urine dipstick LARGE (A) NEGATIVE   Bilirubin Urine NEGATIVE NEGATIVE   Ketones, ur NEGATIVE NEGATIVE mg/dL   Protein, ur NEGATIVE NEGATIVE mg/dL   Urobilinogen, UA 0.2 0.0 - 1.0 mg/dL   Nitrite NEGATIVE NEGATIVE   Leukocytes, UA SMALL (A) NEGATIVE  Urine microscopic-add on     Status: Abnormal   Collection Time: 01/20/15  6:54 AM  Result Value Ref Range   Squamous Epithelial / LPF FEW (A) RARE   WBC, UA 0-2 <3 WBC/hpf   RBC / HPF 3-6 <3 RBC/hpf   Bacteria, UA FEW (A) RARE  Wet prep, genital     Status: Abnormal   Collection Time: 01/20/15  8:30 AM  Result Value Ref Range   Yeast Wet Prep HPF POC NONE SEEN NONE SEEN   Trich, Wet Prep NONE SEEN NONE SEEN   Clue Cells Wet Prep HPF POC FEW (A) NONE SEEN   WBC, Wet Prep HPF POC MANY (A) NONE SEEN    Comment: MANY BACTERIA SEEN    Review of Systems  Constitutional: Negative  for fever.  Gastrointestinal: Positive for constipation (+ straining to have a BM ). Negative for vomiting.  Genitourinary: Negative for dysuria.   Physical Exam   Blood pressure 103/56, pulse 82, temperature 98.7 F (37.1 C), temperature source Oral, resp. rate 18, height 5\' 7"  (1.702 m), weight 77.565 kg (171 lb), last menstrual period 07/02/2014, SpO2 100 %.  Physical Exam  Constitutional: She is oriented to person, place, and time. She appears well-developed and well-nourished. No distress.  HENT:  Head: Normocephalic.  Eyes: Pupils are equal, round, and reactive to light.  Neck: Neck supple.  Respiratory: Effort normal.  GI: Soft. She exhibits no distension. There is no tenderness. There is no rebound and no guarding.  Genitourinary:  Speculum exam: Vagina - Small amount of creamy, pink discharge. Not enough to soak a full fox swab.  Cervix - No contact  bleeding, no active bleeding. Cervix appears friable.  Bimanual exam: Cervix closed  Wet prep done Chaperone present for exam.  Musculoskeletal: Normal range of motion.  Neurological: She is alert and oriented to person, place, and time.  Skin: Skin is warm. She is not diaphoretic.  Psychiatric: Her behavior is normal.   Fetal Tracing: Baseline: 130 bpm  Variability: moderate  Accelerations: 15x15 Decelerations: none Toco: irregular in the beginning, none following [place]  MAU Course  Procedures  None  MDM  A positive blood type Preliminary US shows no sign of abruption or previa; Placenta is posterior.  Discussed HPI> Exam> and [place] results with Dr. Debroah Loop who will come to MAU to discuss plan of care with patient  Assessment and Plan   A:  Vaginal bleeding in third trimester  P:  Dr. Debroah Loop and team to come to MAU to evaluate patient and discuss plan of care.   Duane Lope, NP 01/20/2015 10:40 AM

## 2015-01-20 NOTE — Telephone Encounter (Signed)
1 hour glucose test elevated.  Pt needs 3 hour gtt test.  Contacted patient, informed of results and need for additional testing.  Pt verbalizes understanding and will come on 01/21/15 for 3 hour glucose testing.  Instructions given.

## 2015-01-20 NOTE — MAU Note (Signed)
C/o vaginal bleeding since 0300 this AM; denies any pain;

## 2015-01-20 NOTE — MAU Note (Signed)
Pt reports vaginal bleeding since 3 am, states it has probably filled one pad. Denies pain.

## 2015-01-20 NOTE — Discharge Instructions (Signed)
Call the clinic or go to [facility] if:  You begin to have strong, frequent contractions  Your water breaks.  Sometimes it is a big gush of fluid, sometimes it is just a trickle that keeps getting your panties wet or running down your legs  You have vaginal bleeding.  It is normal to have a small amount of spotting if your cervix was checked.   You don't feel your baby moving like normal.  If you don't, get you something to eat and drink and lay down and focus on feeling your baby move.  You should feel at least 10 movements in 2 hours.  If you don't, you should call the office or go to [facility].   Fetal Kick Counts: Lay down on your left side in a quiet room. You should feel baby move 10 times in 2 hours. If you don't feel baby move that much, eat something sugary and drink some water. Try again. If still not feeling that much movement, come to [facility].

## 2015-01-21 ENCOUNTER — Other Ambulatory Visit: Payer: Medicaid Other

## 2015-01-22 ENCOUNTER — Ambulatory Visit (INDEPENDENT_AMBULATORY_CARE_PROVIDER_SITE_OTHER): Payer: Medicaid Other | Admitting: Family Medicine

## 2015-01-22 VITALS — BP 100/59 | HR 90 | Temp 98.4°F | Wt 169.0 lb

## 2015-01-22 DIAGNOSIS — O4693 Antepartum hemorrhage, unspecified, third trimester: Secondary | ICD-10-CM | POA: Diagnosis not present

## 2015-01-22 DIAGNOSIS — Z3483 Encounter for supervision of other normal pregnancy, third trimester: Secondary | ICD-10-CM

## 2015-01-22 DIAGNOSIS — Z3482 Encounter for supervision of other normal pregnancy, second trimester: Secondary | ICD-10-CM

## 2015-01-22 LAB — POCT URINALYSIS DIP (DEVICE)
Bilirubin Urine: NEGATIVE
GLUCOSE, UA: NEGATIVE mg/dL
Hgb urine dipstick: NEGATIVE
Ketones, ur: NEGATIVE mg/dL
NITRITE: NEGATIVE
PH: 6 (ref 5.0–8.0)
Protein, ur: NEGATIVE mg/dL
Specific Gravity, Urine: 1.015 (ref 1.005–1.030)
UROBILINOGEN UA: 0.2 mg/dL (ref 0.0–1.0)

## 2015-01-22 LAB — CULTURE, OB URINE

## 2015-01-22 NOTE — Progress Notes (Signed)
Subjective:  Melanie Moore is a 24 y.o. G2P0101 at [redacted]w[redacted]d being seen today for ongoing prenatal care.  Patient reports bleeding 2 days ago.  Initially had some clots.  Went to MAU for evaluation.  Had normal [place].  Declined admission because of social reasons.  Doing kick counts, which are normal.  Normal FHT.  No further bleeding.  Contractions: Irregular.  Vag. Bleeding: None. Movement: Present. Denies leaking of fluid.   The following portions of the patient's history were reviewed and updated as appropriate: allergies, current medications, past family history, past medical history, past social history, past surgical history and problem list.   Objective:   Filed Vitals:   01/22/15 0810  BP: 100/59  Pulse: 90  Temp: 98.4 F (36.9 C)  Weight: 169 lb (76.658 kg)    Fetal Status: Fetal Heart Rate (bpm): 145   Movement: Present     General:  Alert, oriented and cooperative. Patient is in no acute distress.  Skin: Skin is warm and dry. No rash noted.   Cardiovascular: Normal heart rate noted  Respiratory: Normal respiratory effort, no problems with respiration noted  Abdomen: Soft, gravid, appropriate for gestational age. Pain/Pressure: Present     Pelvic: Vag. Bleeding: None     Cervical exam deferred        Extremities: Normal range of motion.  Edema: Trace  Mental Status: Normal mood and affect. Normal behavior. Normal judgment and thought content.   Urinalysis: Urine Protein: 1+ Urine Glucose: Negative  Assessment and Plan:  Pregnancy: G2P0101 at [redacted]w[redacted]d  1. Encounter for supervision of other normal pregnancy in second trimester Normal Fundal height, FHT.   - Glucose tolerance, 3 hours  2. Vaginal bleeding in pregnancy, third trimester Counseled pt that if occurs again, should return to MAU for evaluation. Discussed no heavy lifting - nothing greater than 20#.  Preterm labor symptoms and general obstetric precautions including but not limited to vaginal bleeding, contractions,  leaking of fluid and fetal movement were reviewed in detail with the patient. Please refer to After Visit Summary for other counseling recommendations.  Return in about 2 weeks (around 02/05/2015).   Levie Heritage, DO

## 2015-01-22 NOTE — Patient Instructions (Signed)
Third Trimester of Pregnancy The third trimester is from week 29 through week 42, months 7 through 9. The third trimester is a time when the fetus is growing rapidly. At the end of the ninth month, the fetus is about 20 inches in length and weighs 6-10 pounds.  BODY CHANGES Your body goes through many changes during pregnancy. The changes vary from woman to woman.   Your weight will continue to increase. You can expect to gain 25-35 pounds (11-16 kg) by the end of the pregnancy.  You may begin to get stretch marks on your hips, abdomen, and breasts.  You may urinate more often because the fetus is moving lower into your pelvis and pressing on your bladder.  You may develop or continue to have heartburn as a result of your pregnancy.  You may develop constipation because certain hormones are causing the muscles that push waste through your intestines to slow down.  You may develop hemorrhoids or swollen, bulging veins (varicose veins).  You may have pelvic pain because of the weight gain and pregnancy hormones relaxing your joints between the bones in your pelvis. Backaches may result from overexertion of the muscles supporting your posture.  You may have changes in your hair. These can include thickening of your hair, rapid growth, and changes in texture. Some women also have hair loss during or after pregnancy, or hair that feels dry or thin. Your hair will most likely return to normal after your baby is born.  Your breasts will continue to grow and be tender. A yellow discharge may leak from your breasts called colostrum.  Your belly button may stick out.  You may feel short of breath because of your expanding uterus.  You may notice the fetus "dropping," or moving lower in your abdomen.  You may have a bloody mucus discharge. This usually occurs a few days to a week before labor begins.  Your cervix becomes thin and soft (effaced) near your due date. WHAT TO EXPECT AT YOUR PRENATAL  EXAMS  You will have prenatal exams every 2 weeks until week 36. Then, you will have weekly prenatal exams. During a routine prenatal visit:  You will be weighed to make sure you and the fetus are growing normally.  Your blood pressure is taken.  Your abdomen will be measured to track your baby's growth.  The fetal heartbeat will be listened to.  Any test results from the previous visit will be discussed.  You may have a cervical check near your due date to see if you have effaced. At around 36 weeks, your caregiver will check your cervix. At the same time, your caregiver will also perform a test on the secretions of the vaginal tissue. This test is to determine if a type of bacteria, Group B streptococcus, is present. Your caregiver will explain this further. Your caregiver may ask you:  What your birth plan is.  How you are feeling.  If you are feeling the baby move.  If you have had any abnormal symptoms, such as leaking fluid, bleeding, severe headaches, or abdominal cramping.  If you have any questions. Other tests or screenings that may be performed during your third trimester include:  Blood tests that check for low iron levels (anemia).  Fetal testing to check the health, activity level, and growth of the fetus. Testing is done if you have certain medical conditions or if there are problems during the pregnancy. FALSE LABOR You may feel small, irregular contractions that   eventually go away. These are called Braxton Hicks contractions, or false labor. Contractions may last for hours, days, or even weeks before true labor sets in. If contractions come at regular intervals, intensify, or become painful, it is best to be seen by your caregiver.  SIGNS OF LABOR   Menstrual-like cramps.  Contractions that are 5 minutes apart or less.  Contractions that start on the top of the uterus and spread down to the lower abdomen and back.  A sense of increased pelvic pressure or back  pain.  A watery or bloody mucus discharge that comes from the vagina. If you have any of these signs before the 37th week of pregnancy, call your caregiver right away. You need to go to the hospital to get checked immediately. HOME CARE INSTRUCTIONS   Avoid all smoking, herbs, alcohol, and unprescribed drugs. These chemicals affect the formation and growth of the baby.  Follow your caregiver's instructions regarding medicine use. There are medicines that are either safe or unsafe to take during pregnancy.  Exercise only as directed by your caregiver. Experiencing uterine cramps is a good sign to stop exercising.  Continue to eat regular, healthy meals.  Wear a good support bra for breast tenderness.  Do not use hot tubs, steam rooms, or saunas.  Wear your seat belt at all times when driving.  Avoid raw meat, uncooked cheese, cat litter boxes, and soil used by cats. These carry germs that can cause birth defects in the baby.  Take your prenatal vitamins.  Try taking a stool softener (if your caregiver approves) if you develop constipation. Eat more high-fiber foods, such as fresh vegetables or fruit and whole grains. Drink plenty of fluids to keep your urine clear or pale yellow.  Take warm sitz baths to soothe any pain or discomfort caused by hemorrhoids. Use hemorrhoid cream if your caregiver approves.  If you develop varicose veins, wear support hose. Elevate your feet for 15 minutes, 3-4 times a day. Limit salt in your diet.  Avoid heavy lifting, wear low heal shoes, and practice good posture.  Rest a lot with your legs elevated if you have leg cramps or low back pain.  Visit your dentist if you have not gone during your pregnancy. Use a soft toothbrush to brush your teeth and be gentle when you floss.  A sexual relationship may be continued unless your caregiver directs you otherwise.  Do not travel far distances unless it is absolutely necessary and only with the approval  of your caregiver.  Take prenatal classes to understand, practice, and ask questions about the labor and delivery.  Make a trial run to the hospital.  Pack your hospital bag.  Prepare the baby's nursery.  Continue to go to all your prenatal visits as directed by your caregiver. SEEK MEDICAL CARE IF:  You are unsure if you are in labor or if your water has broken.  You have dizziness.  You have mild pelvic cramps, pelvic pressure, or nagging pain in your abdominal area.  You have persistent nausea, vomiting, or diarrhea.  You have a bad smelling vaginal discharge.  You have pain with urination. SEEK IMMEDIATE MEDICAL CARE IF:   You have a fever.  You are leaking fluid from your vagina.  You have spotting or bleeding from your vagina.  You have severe abdominal cramping or pain.  You have rapid weight loss or gain.  You have shortness of breath with chest pain.  You notice sudden or extreme swelling   of your face, hands, ankles, feet, or legs.  You have not felt your baby move in over an hour.  You have severe headaches that do not go away with medicine.  You have vision changes. Document Released: 05/24/2001 Document Revised: 06/04/2013 Document Reviewed: 07/31/2012 [employer] Patient Information 2015 [employer]. This information is not intended to replace advice given to you by your health care provider. Make sure you discuss any questions you have with your health care provider.  

## 2015-01-22 NOTE — Progress Notes (Signed)
Moderate leukocytes noted on urinalysis. 

## 2015-01-23 LAB — GLUCOSE TOLERANCE, 3 HOURS
GLUCOSE 3 HOUR GTT: 104 mg/dL (ref 70–144)
GLUCOSE, 2 HOUR-GESTATIONAL: 81 mg/dL (ref 70–164)
GLUCOSE, FASTING-GESTATIONAL: 85 mg/dL (ref 65–99)
Glucose Tolerance, 1 hour: 164 mg/dL (ref 70–189)

## 2015-02-05 ENCOUNTER — Ambulatory Visit (INDEPENDENT_AMBULATORY_CARE_PROVIDER_SITE_OTHER): Payer: Medicaid Other | Admitting: Obstetrics & Gynecology

## 2015-02-05 VITALS — BP 108/64 | HR 82 | Temp 98.3°F | Wt 171.8 lb

## 2015-02-05 DIAGNOSIS — Z3483 Encounter for supervision of other normal pregnancy, third trimester: Secondary | ICD-10-CM | POA: Diagnosis not present

## 2015-02-05 LAB — POCT URINALYSIS DIP (DEVICE)
Bilirubin Urine: NEGATIVE
Glucose, UA: NEGATIVE mg/dL
Hgb urine dipstick: NEGATIVE
KETONES UR: NEGATIVE mg/dL
NITRITE: NEGATIVE
PH: 5.5 (ref 5.0–8.0)
PROTEIN: NEGATIVE mg/dL
Specific Gravity, Urine: 1.015 (ref 1.005–1.030)
UROBILINOGEN UA: 0.2 mg/dL (ref 0.0–1.0)

## 2015-02-05 NOTE — Progress Notes (Signed)
Subjective:no problems  Melanie Moore is a 24 y.o. G2P0101 at [redacted]w[redacted]d being seen today for ongoing prenatal care.  Patient reports heartburn.  Contractions: Not present.  Vag. Bleeding: None. Movement: Present. Denies leaking of fluid.   The following portions of the patient's history were reviewed and updated as appropriate: allergies, current medications, past family history, past medical history, past social history, past surgical history and problem list.   Objective:   Filed Vitals:   02/05/15 0819  BP: 108/64  Pulse: 82  Temp: 98.3 F (36.8 C)  Weight: 171 lb 12.8 oz (77.928 kg)    Fetal Status:     Movement: Present     General:  Alert, oriented and cooperative. Patient is in no acute distress.  Skin: Skin is warm and dry. No rash noted.   Cardiovascular: Normal heart rate noted  Respiratory: Normal respiratory effort, no problems with respiration noted  Abdomen: Soft, gravid, appropriate for gestational age. Pain/Pressure: Present     Pelvic: Vag. Bleeding: None     Cervical exam deferred        Extremities: Normal range of motion.  Edema: None  Mental Status: Normal mood and affect. Normal behavior. Normal judgment and thought content.   Urinalysis:      Assessment and Plan:  Pregnancy: G2P0101 at [redacted]w[redacted]d  1. Supervision of other normal pregnancy, antepartum, third trimester Routine care  Preterm labor symptoms and general obstetric precautions including but not limited to vaginal bleeding, contractions, leaking of fluid and fetal movement were reviewed in detail with the patient. Please refer to After Visit Summary for other counseling recommendations.  2 weeks  Adam Phenix, MD

## 2015-02-19 ENCOUNTER — Ambulatory Visit (INDEPENDENT_AMBULATORY_CARE_PROVIDER_SITE_OTHER): Payer: Medicaid Other | Admitting: Obstetrics & Gynecology

## 2015-02-19 VITALS — BP 118/71 | HR 73 | Temp 98.7°F | Wt 174.0 lb

## 2015-02-19 DIAGNOSIS — Z23 Encounter for immunization: Secondary | ICD-10-CM | POA: Diagnosis not present

## 2015-02-19 DIAGNOSIS — Z3483 Encounter for supervision of other normal pregnancy, third trimester: Secondary | ICD-10-CM

## 2015-02-19 DIAGNOSIS — R8290 Unspecified abnormal findings in urine: Secondary | ICD-10-CM

## 2015-02-19 LAB — POCT URINALYSIS DIP (DEVICE)
BILIRUBIN URINE: NEGATIVE
Glucose, UA: NEGATIVE mg/dL
KETONES UR: NEGATIVE mg/dL
Nitrite: NEGATIVE
Protein, ur: NEGATIVE mg/dL
SPECIFIC GRAVITY, URINE: 1.02 (ref 1.005–1.030)
Urobilinogen, UA: 0.2 mg/dL (ref 0.0–1.0)
pH: 6.5 (ref 5.0–8.0)

## 2015-02-19 NOTE — Progress Notes (Signed)
Breastfeeding tip of the week reviewed Flu vaccine today Trace hgb, large amt leukocytes in urine

## 2015-02-19 NOTE — Patient Instructions (Signed)
Return to clinic for any obstetric concerns or go to MAU for evaluation  

## 2015-02-19 NOTE — Progress Notes (Signed)
Subjective:  Melanie Moore is a 24 y.o. G2P0101 at [redacted]w[redacted]d being seen today for ongoing prenatal care.  Patient reports no complaints.  Contractions: Not present.  Vag. Bleeding: None. Movement: Present. Denies leaking of fluid.   The following portions of the patient's history were reviewed and updated as appropriate: allergies, current medications, past family history, past medical history, past social history, past surgical history and problem list.   Objective:   Filed Vitals:   02/19/15 1043  BP: 118/71  Pulse: 73  Temp: 98.7 F (37.1 C)  Weight: 174 lb (78.926 kg)    Fetal Status: Fetal Heart Rate (bpm): 147 Fundal Height: 36 cm Movement: Present     General:  Alert, oriented and cooperative. Patient is in no acute distress.  Skin: Skin is warm and dry. No rash noted.   Cardiovascular: Normal heart rate noted  Respiratory: Normal respiratory effort, no problems with respiration noted  Abdomen: Soft, gravid, appropriate for gestational age. Pain/Pressure: Present     Pelvic: Vag. Bleeding: None Vag D/C Character: White   Cervical exam deferred        Extremities: Normal range of motion.  Edema: None  Mental Status: Normal mood and affect. Normal behavior. Normal judgment and thought content.   Urinalysis: Urine Protein: Negative Urine Glucose: Negative  Assessment and Plan:  Pregnancy: G2P0101 at [redacted]w[redacted]d  1. Needs flu shot - Flu Vaccine QUAD 36+ mos IM; Standing - Flu Vaccine QUAD 36+ mos IM  2. Abnormal urinalysis - Culture, OB Urine  3. Supervision of other normal pregnancy, antepartum, third trimester Preterm labor symptoms and general obstetric precautions including but not limited to vaginal bleeding, contractions, leaking of fluid and fetal movement were reviewed in detail with the patient. Please refer to After Visit Summary for other counseling recommendations.  Return in about 2 weeks (around 03/05/2015) for OB Visit, Pelvic cultures.   Tereso Newcomer, MD

## 2015-02-20 LAB — CULTURE, OB URINE: Colony Count: 5000

## 2015-03-05 ENCOUNTER — Other Ambulatory Visit (HOSPITAL_COMMUNITY)
Admission: RE | Admit: 2015-03-05 | Discharge: 2015-03-05 | Disposition: A | Discharge status: Home / Self Care | Payer: Medicaid Other | Source: Ambulatory Visit | Attending: Family Medicine | Admitting: Family Medicine

## 2015-03-05 ENCOUNTER — Ambulatory Visit (INDEPENDENT_AMBULATORY_CARE_PROVIDER_SITE_OTHER): Payer: Medicaid Other | Admitting: Family Medicine

## 2015-03-05 VITALS — BP 127/71 | HR 78 | Wt 181.1 lb

## 2015-03-05 DIAGNOSIS — Z3483 Encounter for supervision of other normal pregnancy, third trimester: Secondary | ICD-10-CM | POA: Diagnosis not present

## 2015-03-05 DIAGNOSIS — Z113 Encounter for screening for infections with a predominantly sexual mode of transmission: Secondary | ICD-10-CM | POA: Diagnosis not present

## 2015-03-05 DIAGNOSIS — O09293 Supervision of pregnancy with other poor reproductive or obstetric history, third trimester: Secondary | ICD-10-CM

## 2015-03-05 DIAGNOSIS — O352XX1 Maternal care for (suspected) hereditary disease in fetus, fetus 1: Secondary | ICD-10-CM | POA: Diagnosis not present

## 2015-03-05 DIAGNOSIS — Z118 Encounter for screening for other infectious and parasitic diseases: Secondary | ICD-10-CM

## 2015-03-05 LAB — POCT URINALYSIS DIP (DEVICE)
Bilirubin Urine: NEGATIVE
GLUCOSE, UA: NEGATIVE mg/dL
Ketones, ur: NEGATIVE mg/dL
NITRITE: NEGATIVE
PROTEIN: NEGATIVE mg/dL
SPECIFIC GRAVITY, URINE: 1.02 (ref 1.005–1.030)
UROBILINOGEN UA: 0.2 mg/dL (ref 0.0–1.0)
pH: 6.5 (ref 5.0–8.0)

## 2015-03-05 LAB — OB RESULTS CONSOLE GBS: STREP GROUP B AG: POSITIVE

## 2015-03-05 LAB — OB RESULTS CONSOLE GC/CHLAMYDIA: GC PROBE AMP, GENITAL: NEGATIVE

## 2015-03-05 NOTE — Patient Instructions (Signed)
Third Trimester of Pregnancy The third trimester is from week 29 through week 42, months 7 through 9. The third trimester is a time when the fetus is growing rapidly. At the end of the ninth month, the fetus is about 20 inches in length and weighs 6-10 pounds.  BODY CHANGES Your body goes through many changes during pregnancy. The changes vary from woman to woman.   Your weight will continue to increase. You can expect to gain 25-35 pounds (11-16 kg) by the end of the pregnancy.  You may begin to get stretch marks on your hips, abdomen, and breasts.  You may urinate more often because the fetus is moving lower into your pelvis and pressing on your bladder.  You may develop or continue to have heartburn as a result of your pregnancy.  You may develop constipation because certain hormones are causing the muscles that push waste through your intestines to slow down.  You may develop hemorrhoids or swollen, bulging veins (varicose veins).  You may have pelvic pain because of the weight gain and pregnancy hormones relaxing your joints between the bones in your pelvis. Backaches may result from overexertion of the muscles supporting your posture.  You may have changes in your hair. These can include thickening of your hair, rapid growth, and changes in texture. Some women also have hair loss during or after pregnancy, or hair that feels dry or thin. Your hair will most likely return to normal after your baby is born.  Your breasts will continue to grow and be tender. A yellow discharge may leak from your breasts called colostrum.  Your belly button may stick out.  You may feel short of breath because of your expanding uterus.  You may notice the fetus "dropping," or moving lower in your abdomen.  You may have a bloody mucus discharge. This usually occurs a few days to a week before labor begins.  Your cervix becomes thin and soft (effaced) near your due date. WHAT TO EXPECT AT YOUR  PRENATAL EXAMS  You will have prenatal exams every 2 weeks until week 36. Then, you will have weekly prenatal exams. During a routine prenatal visit:  You will be weighed to make sure you and the fetus are growing normally.  Your blood pressure is taken.  Your abdomen will be measured to track your baby's growth.  The fetal heartbeat will be listened to.  Any test results from the previous visit will be discussed.  You may have a cervical check near your due date to see if you have effaced. At around 36 weeks, your caregiver will check your cervix. At the same time, your caregiver will also perform a test on the secretions of the vaginal tissue. This test is to determine if a type of bacteria, Group B streptococcus, is present. Your caregiver will explain this further. Your caregiver may ask you:  What your birth plan is.  How you are feeling.  If you are feeling the baby move.  If you have had any abnormal symptoms, such as leaking fluid, bleeding, severe headaches, or abdominal cramping.  If you have any questions. Other tests or screenings that may be performed during your third trimester include:  Blood tests that check for low iron levels (anemia).  Fetal testing to check the health, activity level, and growth of the fetus. Testing is done if you have certain medical conditions or if there are problems during the pregnancy. FALSE LABOR You may feel small, irregular contractions that   eventually go away. These are called Braxton Hicks contractions, or false labor. Contractions may last for hours, days, or even weeks before true labor sets in. If contractions come at regular intervals, intensify, or become painful, it is best to be seen by your caregiver.  SIGNS OF LABOR   Menstrual-like cramps.  Contractions that are 5 minutes apart or less.  Contractions that start on the top of the uterus and spread down to the lower abdomen and back.  A sense of increased pelvic  pressure or back pain.  A watery or bloody mucus discharge that comes from the vagina. If you have any of these signs before the 37th week of pregnancy, call your caregiver right away. You need to go to the hospital to get checked immediately. HOME CARE INSTRUCTIONS   Avoid all smoking, herbs, alcohol, and unprescribed drugs. These chemicals affect the formation and growth of the baby.  Follow your caregiver's instructions regarding medicine use. There are medicines that are either safe or unsafe to take during pregnancy.  Exercise only as directed by your caregiver. Experiencing uterine cramps is a good sign to stop exercising.  Continue to eat regular, healthy meals.  Wear a good support bra for breast tenderness.  Do not use hot tubs, steam rooms, or saunas.  Wear your seat belt at all times when driving.  Avoid raw meat, uncooked cheese, cat litter boxes, and soil used by cats. These carry germs that can cause birth defects in the baby.  Take your prenatal vitamins.  Try taking a stool softener (if your caregiver approves) if you develop constipation. Eat more high-fiber foods, such as fresh vegetables or fruit and whole grains. Drink plenty of fluids to keep your urine clear or pale yellow.  Take warm sitz baths to soothe any pain or discomfort caused by hemorrhoids. Use hemorrhoid cream if your caregiver approves.  If you develop varicose veins, wear support hose. Elevate your feet for 15 minutes, 3-4 times a day. Limit salt in your diet.  Avoid heavy lifting, wear low heal shoes, and practice good posture.  Rest a lot with your legs elevated if you have leg cramps or low back pain.  Visit your dentist if you have not gone during your pregnancy. Use a soft toothbrush to brush your teeth and be gentle when you floss.  A sexual relationship may be continued unless your caregiver directs you otherwise.  Do not travel far distances unless it is absolutely necessary and only  with the approval of your caregiver.  Take prenatal classes to understand, practice, and ask questions about the labor and delivery.  Make a trial run to the hospital.  Pack your hospital bag.  Prepare the baby's nursery.  Continue to go to all your prenatal visits as directed by your caregiver. SEEK MEDICAL CARE IF:  You are unsure if you are in labor or if your water has broken.  You have dizziness.  You have mild pelvic cramps, pelvic pressure, or nagging pain in your abdominal area.  You have persistent nausea, vomiting, or diarrhea.  You have a bad smelling vaginal discharge.  You have pain with urination. SEEK IMMEDIATE MEDICAL CARE IF:   You have a fever.  You are leaking fluid from your vagina.  You have spotting or bleeding from your vagina.  You have severe abdominal cramping or pain.  You have rapid weight loss or gain.  You have shortness of breath with chest pain.  You notice sudden or extreme swelling   of your face, hands, ankles, feet, or legs.  You have not felt your baby move in over an hour.  You have severe headaches that do not go away with medicine.  You have vision changes. Document Released: 05/24/2001 Document Revised: 06/04/2013 Document Reviewed: 07/31/2012 [employer] Patient Information 2015 [employer]. This information is not intended to replace advice given to you by your health care Essance Gatti. Make sure you discuss any questions you have with your health care Dam Ashraf.  Breastfeeding Deciding to breastfeed is one of the best choices you can make for you and your baby. A change in hormones during pregnancy causes your breast tissue to grow and increases the number and size of your milk ducts. These hormones also allow proteins, sugars, and fats from your blood supply to make breast milk in your milk-producing glands. Hormones prevent breast milk from being released before your baby is born as well as prompt milk flow after birth. Once  breastfeeding has begun, thoughts of your baby, as well as his or her sucking or crying, can stimulate the release of milk from your milk-producing glands.  BENEFITS OF BREASTFEEDING For Your Baby  Your first milk (colostrum) helps your baby's digestive system function better.   There are antibodies in your milk that help your baby fight off infections.   Your baby has a lower incidence of asthma, allergies, and sudden infant death syndrome.   The nutrients in breast milk are better for your baby than infant formulas and are designed uniquely for your baby's needs.   Breast milk improves your baby's brain development.   Your baby is less likely to develop other conditions, such as childhood obesity, asthma, or type 2 diabetes mellitus.  For You   Breastfeeding helps to create a very special bond between you and your baby.   Breastfeeding is convenient. Breast milk is always available at the correct temperature and costs nothing.   Breastfeeding helps to burn calories and helps you lose the weight gained during pregnancy.   Breastfeeding makes your uterus contract to its prepregnancy size faster and slows bleeding (lochia) after you give birth.   Breastfeeding helps to lower your risk of developing type 2 diabetes mellitus, osteoporosis, and breast or ovarian cancer later in life. SIGNS THAT YOUR BABY IS HUNGRY Early Signs of Hunger  Increased alertness or activity.  Stretching.  Movement of the head from side to side.  Movement of the head and opening of the mouth when the corner of the mouth or cheek is stroked (rooting).  Increased sucking sounds, smacking lips, cooing, sighing, or squeaking.  Hand-to-mouth movements.  Increased sucking of fingers or hands. Late Signs of Hunger  Fussing.  Intermittent crying. Extreme Signs of Hunger Signs of extreme hunger will require calming and consoling before your baby will be able to breastfeed successfully. Do not  wait for the following signs of extreme hunger to occur before you initiate breastfeeding:   Restlessness.  A loud, strong cry.   Screaming. BREASTFEEDING BASICS Breastfeeding Initiation  Find a comfortable place to sit or lie down, with your neck and back well supported.  Place a pillow or rolled up blanket under your baby to bring him or her to the level of your breast (if you are seated). Nursing pillows are specially designed to help support your arms and your baby while you breastfeed.  Make sure that your baby's abdomen is facing your abdomen.   Gently massage your breast. With your fingertips, massage from your chest   wall toward your nipple in a circular motion. This encourages milk flow. You may need to continue this action during the feeding if your milk flows slowly.  Support your breast with 4 fingers underneath and your thumb above your nipple. Make sure your fingers are well away from your nipple and your baby's mouth.   Stroke your baby's lips gently with your finger or nipple.   When your baby's mouth is open wide enough, quickly bring your baby to your breast, placing your entire nipple and as much of the colored area around your nipple (areola) as possible into your baby's mouth.   More areola should be visible above your baby's upper lip than below the lower lip.   Your baby's tongue should be between his or her lower gum and your breast.   Ensure that your baby's mouth is correctly positioned around your nipple (latched). Your baby's lips should create a seal on your breast and be turned out (everted).  It is common for your baby to suck about 2-3 minutes in order to start the flow of breast milk. Latching Teaching your baby how to latch on to your breast properly is very important. An improper latch can cause nipple pain and decreased milk supply for you and poor weight gain in your baby. Also, if your baby is not latched onto your nipple properly, he or she  may swallow some air during feeding. This can make your baby fussy. Burping your baby when you switch breasts during the feeding can help to get rid of the air. However, teaching your baby to latch on properly is still the best way to prevent fussiness from swallowing air while breastfeeding. Signs that your baby has successfully latched on to your nipple:    Silent tugging or silent sucking, without causing you pain.   Swallowing heard between every 3-4 sucks.    Muscle movement above and in front of his or her ears while sucking.  Signs that your baby has not successfully latched on to nipple:   Sucking sounds or smacking sounds from your baby while breastfeeding.  Nipple pain. If you think your baby has not latched on correctly, slip your finger into the corner of your baby's mouth to break the suction and place it between your baby's gums. Attempt breastfeeding initiation again. Signs of Successful Breastfeeding Signs from your baby:   A gradual decrease in the number of sucks or complete cessation of sucking.   Falling asleep.   Relaxation of his or her body.   Retention of a small amount of milk in his or her mouth.   Letting go of your breast by himself or herself. Signs from you:  Breasts that have increased in firmness, weight, and size 1-3 hours after feeding.   Breasts that are softer immediately after breastfeeding.  Increased milk volume, as well as a change in milk consistency and color by the fifth day of breastfeeding.   Nipples that are not sore, cracked, or bleeding. Signs That Your Baby is Getting Enough Milk  Wetting at least 3 diapers in a 24-hour period. The urine should be clear and pale yellow by age 5 days.  At least 3 stools in a 24-hour period by age 5 days. The stool should be soft and yellow.  At least 3 stools in a 24-hour period by age 7 days. The stool should be seedy and yellow.  No loss of weight greater than 10% of birth weight  during the first 3   days of age.  Average weight gain of 4-7 ounces (113-198 g) per week after age 4 days.  Consistent daily weight gain by age 5 days, without weight loss after the age of 2 weeks. After a feeding, your baby may spit up a small amount. This is common. BREASTFEEDING FREQUENCY AND DURATION Frequent feeding will help you make more milk and can prevent sore nipples and breast engorgement. Breastfeed when you feel the need to reduce the fullness of your breasts or when your baby shows signs of hunger. This is called "breastfeeding on demand." Avoid introducing a pacifier to your baby while you are working to establish breastfeeding (the first 4-6 weeks after your baby is born). After this time you may choose to use a pacifier. Research has shown that pacifier use during the first year of a baby's life decreases the risk of sudden infant death syndrome (SIDS). Allow your baby to feed on each breast as long as he or she wants. Breastfeed until your baby is finished feeding. When your baby unlatches or falls asleep while feeding from the first breast, offer the second breast. Because newborns are often sleepy in the first few weeks of life, you may need to awaken your baby to get him or her to feed. Breastfeeding times will vary from baby to baby. However, the following rules can serve as a guide to help you ensure that your baby is properly fed:  Newborns (babies 4 weeks of age or younger) may breastfeed every 1-3 hours.  Newborns should not go longer than 3 hours during the day or 5 hours during the night without breastfeeding.  You should breastfeed your baby a minimum of 8 times in a 24-hour period until you begin to introduce solid foods to your baby at around 6 months of age. BREAST MILK PUMPING Pumping and storing breast milk allows you to ensure that your baby is exclusively fed your breast milk, even at times when you are unable to breastfeed. This is especially important if you are  going back to work while you are still breastfeeding or when you are not able to be present during feedings. Your lactation consultant can give you guidelines on how long it is safe to store breast milk.  A breast pump is a machine that allows you to pump milk from your breast into a sterile bottle. The pumped breast milk can then be stored in a refrigerator or freezer. Some breast pumps are operated by hand, while others use electricity. Ask your lactation consultant which type will work best for you. Breast pumps can be purchased, but some hospitals and breastfeeding support groups lease breast pumps on a monthly basis. A lactation consultant can teach you how to hand express breast milk, if you prefer not to use a pump.  CARING FOR YOUR BREASTS WHILE YOU BREASTFEED Nipples can become dry, cracked, and sore while breastfeeding. The following recommendations can help keep your breasts moisturized and healthy:  Avoid using soap on your nipples.   Wear a supportive bra. Although not required, special nursing bras and tank tops are designed to allow access to your breasts for breastfeeding without taking off your entire bra or top. Avoid wearing underwire-style bras or extremely tight bras.  Air dry your nipples for 3-4minutes after each feeding.   Use only cotton bra pads to absorb leaked breast milk. Leaking of breast milk between feedings is normal.   Use lanolin on your nipples after breastfeeding. Lanolin helps to maintain your skin's   normal moisture barrier. If you use pure lanolin, you do not need to wash it off before feeding your baby again. Pure lanolin is not toxic to your baby. You may also hand express a few drops of breast milk and gently massage that milk into your nipples and allow the milk to air dry. In the first few weeks after giving birth, some women experience extremely full breasts (engorgement). Engorgement can make your breasts feel heavy, warm, and tender to the touch.  Engorgement peaks within 3-5 days after you give birth. The following recommendations can help ease engorgement:  Completely empty your breasts while breastfeeding or pumping. You may want to start by applying warm, moist heat (in the shower or with warm water-soaked hand towels) just before feeding or pumping. This increases circulation and helps the milk flow. If your baby does not completely empty your breasts while breastfeeding, pump any extra milk after he or she is finished.  Wear a snug bra (nursing or regular) or tank top for 1-2 days to signal your body to slightly decrease milk production.  Apply ice packs to your breasts, unless this is too uncomfortable for you.  Make sure that your baby is latched on and positioned properly while breastfeeding. If engorgement persists after 48 hours of following these recommendations, contact your health care Alyan Hartline or a lactation consultant. OVERALL HEALTH CARE RECOMMENDATIONS WHILE BREASTFEEDING  Eat healthy foods. Alternate between meals and snacks, eating 3 of each per day. Because what you eat affects your breast milk, some of the foods may make your baby more irritable than usual. Avoid eating these foods if you are sure that they are negatively affecting your baby.  Drink milk, fruit juice, and water to satisfy your thirst (about 10 glasses a day).   Rest often, relax, and continue to take your prenatal vitamins to prevent fatigue, stress, and anemia.  Continue breast self-awareness checks.  Avoid chewing and smoking tobacco.  Avoid alcohol and drug use. Some medicines that may be harmful to your baby can pass through breast milk. It is important to ask your health care Brookelynn Hamor before taking any medicine, including all over-the-counter and prescription medicine as well as vitamin and herbal supplements. It is possible to become pregnant while breastfeeding. If birth control is desired, ask your health care Mendel Binsfeld about options that  will be safe for your baby. SEEK MEDICAL CARE IF:   You feel like you want to stop breastfeeding or have become frustrated with breastfeeding.  You have painful breasts or nipples.  Your nipples are cracked or bleeding.  Your breasts are red, tender, or warm.  You have a swollen area on either breast.  You have a fever or chills.  You have nausea or vomiting.  You have drainage other than breast milk from your nipples.  Your breasts do not become full before feedings by the fifth day after you give birth.  You feel sad and depressed.  Your baby is too sleepy to eat well.  Your baby is having trouble sleeping.   Your baby is wetting less than 3 diapers in a 24-hour period.  Your baby has less than 3 stools in a 24-hour period.  Your baby's skin or the white part of his or her eyes becomes yellow.   Your baby is not gaining weight by 5 days of age. SEEK IMMEDIATE MEDICAL CARE IF:   Your baby is overly tired (lethargic) and does not want to wake up and feed.  Your baby   develops an unexplained fever. Document Released: 05/30/2005 Document Revised: 06/04/2013 Document Reviewed: 11/21/2012 [employer] Patient Information 2015 [employer]. This information is not intended to replace advice given to you by your health care Abbygael Curtiss. Make sure you discuss any questions you have with your health care Maurio Baize.  

## 2015-03-05 NOTE — Progress Notes (Signed)
Subjective:  Melanie Moore is a 24 y.o. G2P0101 at [redacted]w[redacted]d being seen today for ongoing prenatal care.  Patient reports swelling in left foot, wegith gain.  Contractions: Irregular.  Vag. Bleeding: None. Movement: Present. Denies leaking of fluid.   Swelling in left foot, not painful. Not pitting. Alleviated by elevation. Worse at the end of the day  The following portions of the patient's history were reviewed and updated as appropriate: allergies, current medications, past family history, past medical history, past social history, past surgical history and problem list.   Objective:   Filed Vitals:   03/05/15 1036  BP: 127/71  Pulse: 78  Weight: 181 lb 1.6 oz (82.146 kg)    Fetal Status: Fetal Heart Rate (bpm): 124   Movement: Present     General:  Alert, oriented and cooperative. Patient is in no acute distress.  Skin: Skin is warm and dry. No rash noted.   Cardiovascular: Normal heart rate noted  Respiratory: Normal respiratory effort, no problems with respiration noted  Abdomen: Soft, gravid, appropriate for gestational age. Pain/Pressure: Present     Pelvic: Vag. Bleeding: None     Cervical exam performed        Extremities: Normal range of motion.  Edema: Mild pitting, slight indentation  Mental Status: Normal mood and affect. Normal behavior. Normal judgment and thought content.   Urinalysis: Urine Protein: Negative Urine Glucose: Negative  Assessment and Plan:  Pregnancy: G2P0101 at [redacted]w[redacted]d  1. Supervision of other normal pregnancy, antepartum, third trimester -updated pregnancy box - Reviewed labor precautions in detail - Reviewed normal weight gain in pregnancy and healthy diet/exercise - reviewed physiology of LLE>RLE swelling and I am not concerned for DVT or BP related edema in the setting of normal BP.   2. 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 Hereditary disease in family possibly affecting fetus, affecting management  of mother, antepartum condition or complication, fetus 1  Term labor symptoms and general obstetric precautions including but not limited to vaginal bleeding, contractions, leaking of fluid and fetal movement were reviewed in detail with the patient. Please refer to After Visit Summary for other counseling recommendations.  Return in about 1 week (around 03/12/2015) for Routine prenatal care.   Federico Flake, MD

## 2015-03-05 NOTE — Progress Notes (Signed)
Urinalysis shows trace hemoglobin and small leukocytes.c/o increased swelling, more at times, especially left foot but denies pain unless foot touched. 7 lb weight gain noted.

## 2015-03-05 NOTE — Addendum Note (Signed)
Addended byGerome Apley on: 03/05/2015 11:51 AM   Modules accepted: Orders

## 2015-03-05 NOTE — Addendum Note (Signed)
Addended byGerome Apley on: 03/05/2015 11:45 AM   Modules accepted: Orders

## 2015-03-06 ENCOUNTER — Encounter: Payer: Self-pay | Admitting: Family Medicine

## 2015-03-06 DIAGNOSIS — O99820 Streptococcus B carrier state complicating pregnancy: Secondary | ICD-10-CM | POA: Insufficient documentation

## 2015-03-06 LAB — CULTURE, BETA STREP (GROUP B ONLY)

## 2015-03-06 LAB — GC/CHLAMYDIA PROBE AMP (~~LOC~~) NOT AT ARMC
Chlamydia: NEGATIVE
Neisseria Gonorrhea: NEGATIVE

## 2015-03-10 ENCOUNTER — Inpatient Hospital Stay (HOSPITAL_COMMUNITY)
Admission: AD | Admit: 2015-03-10 | Discharge: 2015-03-10 | Disposition: A | Discharge status: Home / Self Care | Payer: Medicaid Other | Source: Ambulatory Visit | Attending: Obstetrics and Gynecology | Admitting: Obstetrics and Gynecology

## 2015-03-10 ENCOUNTER — Encounter (HOSPITAL_COMMUNITY): Payer: Self-pay | Admitting: *Deleted

## 2015-03-10 DIAGNOSIS — Z3A37 37 weeks gestation of pregnancy: Secondary | ICD-10-CM | POA: Insufficient documentation

## 2015-03-10 DIAGNOSIS — O1203 Gestational edema, third trimester: Secondary | ICD-10-CM | POA: Diagnosis present

## 2015-03-10 LAB — URINALYSIS, ROUTINE W REFLEX MICROSCOPIC
BILIRUBIN URINE: NEGATIVE
Glucose, UA: NEGATIVE mg/dL
Hgb urine dipstick: NEGATIVE
Ketones, ur: 15 mg/dL — AB
NITRITE: NEGATIVE
Protein, ur: NEGATIVE mg/dL
Specific Gravity, Urine: 1.025 (ref 1.005–1.030)
Urobilinogen, UA: 0.2 mg/dL (ref 0.0–1.0)
pH: 6 (ref 5.0–8.0)

## 2015-03-10 LAB — URINE MICROSCOPIC-ADD ON

## 2015-03-10 NOTE — MAU Note (Signed)
C/o swollen feet for past 2-3 weeks but increased swelling since last night;

## 2015-03-10 NOTE — Discharge Instructions (Signed)
Edema  Edema is an abnormal buildup of fluids. It is more common in your legs and thighs. Painless swelling of the feet and ankles is more likely as a person ages. It also is common in looser skin, like around your eyes.  HOME CARE   · Keep the affected body part above the level of the heart while lying down.  · Do not sit still or stand for a long time.  · Do not put anything right under your knees when you lie down.  · Do not wear tight clothes on your upper legs.  · Exercise your legs to help the puffiness (swelling) go down.  · Wear elastic bandages or support stockings as told by your doctor.  · A low-salt diet may help lessen the puffiness.  · Only take medicine as told by your doctor.  GET HELP IF:  · Treatment is not working.  · You have heart, liver, or kidney disease and notice that your skin looks puffy or shiny.  · You have puffiness in your legs that does not get better when you raise your legs.  · You have sudden weight gain for no reason.  GET HELP RIGHT AWAY IF:   · You have shortness of breath or chest pain.  · You cannot breathe when you lie down.  · You have pain, redness, or warmth in the areas that are puffy.  · You have heart, liver, or kidney disease and get edema all of a sudden.  · You have a fever and your symptoms get worse all of a sudden.  MAKE SURE YOU:   · Understand these instructions.  · Will watch your condition.  · Will get help right away if you are not doing well or get worse.  Document Released: 11/16/2007 Document Revised: 06/04/2013 Document Reviewed: 03/22/2013  Exit[employer]® Patient Information ©2015 [employer]. This information is not intended to replace advice given to you by your health care provider. Make sure you discuss any questions you have with your health care provider.

## 2015-03-10 NOTE — MAU Provider Note (Signed)
History     CSN: [id number]  Arrival date and time: 03/10/15 8295   First Bellarae Lizer Initiated Contact with Patient 03/10/15 1907      Chief Complaint  Patient presents with  . Leg Swelling   HPI  Melanie Moore is a 24 y.o. G2P0101 at [redacted]w[redacted]d who presents for swelling of her feet & ankles.  Noticed swelling x 2 weeks, has worsened over the last 2 days.  Denies headache, vision changes, epigastric pain, or elevated BP.  Denies abdominal pain, vaginal bleeding, or LOF.  Positive fetal movement.   OB History    Gravida Para Term Preterm AB TAB SAB Ectopic Multiple Living   Past Medical History  Diagnosis Date  . Medical history non-contributory   . LGSIL (low grade squamous intraepithelial lesion) on Pap smear 11/19/2012    Repeat pap in one year as per ASCCP guidelines    Past Surgical History  Procedure Laterality Date  . Eye surgery  2008    sty removed both eyes    Family History  Problem Relation Age of Onset  . Diabetes Father   . Hyperlipidemia Father   . Diabetes Maternal Grandfather   . Diabetes Paternal Grandmother   . Diabetes Paternal Grandfather   . Depression Mother   . Fibromyalgia Mother     Social History  Substance Use Topics  . Smoking status: Never Smoker   . Smokeless tobacco: Never Used  . Alcohol Use: No     Comment: socially before pregnancy    Allergies: No Known Allergies  Prescriptions prior to admission  Medication Sig Dispense Refill Last Dose  . Prenatal Vit-Fe Fumarate-FA (PRENATAL MULTIVITAMIN) TABS Take 1 tablet by mouth at bedtime.   Taking    Review of Systems  Constitutional: Negative.  Negative for fever.  HENT: Negative.   Eyes: Negative.  Negative for blurred vision.  Respiratory: Negative.  Negative for cough and shortness of breath.   Cardiovascular: Positive for leg swelling. Negative for chest pain and palpitations.  Gastrointestinal: Negative.  Negative for abdominal pain.  Genitourinary:  Negative.   Neurological: Negative.  Negative for dizziness, tingling and headaches.   Physical Exam   Blood pressure 112/65, pulse 69, temperature 98.7 F (37.1 C), temperature source Oral, resp. rate 18, last menstrual period 07/02/2014.  Physical Exam  Nursing note and vitals reviewed. Constitutional: She is oriented to person, place, and time. She appears well-developed and well-nourished. No distress.  HENT:  Head: Normocephalic and atraumatic.  Eyes: Conjunctivae are normal. Right eye exhibits no discharge. Left eye exhibits no discharge. No scleral icterus.  Neck: Normal range of motion.  Cardiovascular: Normal rate, regular rhythm and normal heart sounds.   No murmur heard. Respiratory: Effort normal and breath sounds normal. No respiratory distress. She has no wheezes.  GI: Soft.  Musculoskeletal: She exhibits edema. She exhibits no tenderness.       Right ankle: She exhibits swelling. She exhibits normal range of motion and normal pulse. No tenderness.       Left ankle: She exhibits swelling. She exhibits normal range of motion and normal pulse. No tenderness.  Pitting edema in bilateral feet & ankles. Normal pedal pulses. No pretibial edema.    Neurological: She is alert and oriented to person, place, and time. She has normal reflexes.  Reflex Scores:      Patellar reflexes are 2+ on the right side and 2+ on  the left side. No clonus  Skin: Skin is warm and dry. She is not diaphoretic.  Psychiatric: She has a normal mood and affect. Her behavior is normal. Judgment and thought content normal.   Fetal Tracing:  Baseline: 135 Variability: moderate  Accelerations: 15x15 Decelerations: none  Toco: 5-7 mins, pt states not painful  MAU Course  Procedures Results for orders placed or performed during the hospital encounter of 03/10/15 (from the past 24 hour(s))  Urinalysis, Routine w reflex microscopic (not at [facility])     Status: Abnormal   Collection Time: 03/10/15  6:40  PM  Result Value Ref Range   Color, Urine YELLOW YELLOW   APPearance CLEAR CLEAR   Specific Gravity, Urine 1.025 1.005 - 1.030   pH 6.0 5.0 - 8.0   Glucose, UA NEGATIVE NEGATIVE mg/dL   Hgb urine dipstick NEGATIVE NEGATIVE   Bilirubin Urine NEGATIVE NEGATIVE   Ketones, ur 15 (A) NEGATIVE mg/dL   Protein, ur NEGATIVE NEGATIVE mg/dL   Urobilinogen, UA 0.2 0.0 - 1.0 mg/dL   Nitrite NEGATIVE NEGATIVE   Leukocytes, UA MODERATE (A) NEGATIVE  Urine microscopic-add on     Status: None   Collection Time: 03/10/15  6:40 PM  Result Value Ref Range   Squamous Epithelial / LPF RARE RARE   WBC, UA 0-2 <3 WBC/hpf   RBC / HPF 0-2 <3 RBC/hpf   Bacteria, UA RARE RARE   Urine-Other MUCOUS PRESENT     MDM Category 1 tracing BP normal, no other symptoms.  Discussed symptomatic treatment as well as s/s of preeclampsia. Has next appt in 2 days with Dr. Debroah Loop.   Assessment and Plan  A: 1. Edema during pregnancy in third trimester    P: Discharge home Keep scheduled prenatal appt in [facility] Discussed reasons to return to MAU including s/s of preeclampsia Urine culture pending  Judeth Horn, NP  03/10/2015, 7:07 PM

## 2015-03-11 LAB — CULTURE, OB URINE

## 2015-03-12 ENCOUNTER — Encounter: Payer: Self-pay | Admitting: Obstetrics & Gynecology

## 2015-03-12 ENCOUNTER — Ambulatory Visit (INDEPENDENT_AMBULATORY_CARE_PROVIDER_SITE_OTHER): Payer: Medicaid Other | Admitting: Obstetrics & Gynecology

## 2015-03-12 VITALS — BP 115/64 | HR 80 | Temp 98.0°F | Wt 180.4 lb

## 2015-03-12 DIAGNOSIS — Z3483 Encounter for supervision of other normal pregnancy, third trimester: Secondary | ICD-10-CM | POA: Diagnosis not present

## 2015-03-12 LAB — POCT URINALYSIS DIP (DEVICE)
Bilirubin Urine: NEGATIVE
GLUCOSE, UA: NEGATIVE mg/dL
Ketones, ur: NEGATIVE mg/dL
NITRITE: NEGATIVE
Protein, ur: NEGATIVE mg/dL
SPECIFIC GRAVITY, URINE: 1.02 (ref 1.005–1.030)
UROBILINOGEN UA: 0.2 mg/dL (ref 0.0–1.0)
pH: 7 (ref 5.0–8.0)

## 2015-03-12 NOTE — Progress Notes (Signed)
Swelling in left foot stable  Subjective:  Melanie Moore is a 24 y.o. G2P0101 at [redacted]w[redacted]d being seen today for ongoing prenatal care.  Patient reports no complaints.  Contractions: Irregular.  Vag. Bleeding: None. Movement: Present. Denies leaking of fluid.   The following portions of the patient's history were reviewed and updated as appropriate: allergies, current medications, past family history, past medical history, past social history, past surgical history and problem list.   Objective:   Filed Vitals:   03/12/15 1044  BP: 115/64  Pulse: 80  Temp: 98 F (36.7 C)  Weight: 180 lb 6.4 oz (81.829 kg)    Fetal Status: Fetal Heart Rate (bpm): 138   Movement: Present     General:  Alert, oriented and cooperative. Patient is in no acute distress.  Skin: Skin is warm and dry. No rash noted.   Cardiovascular: Normal heart rate noted  Respiratory: Normal respiratory effort, no problems with respiration noted  Abdomen: Soft, gravid, appropriate for gestational age. Pain/Pressure: Present     Pelvic: Vag. Bleeding: None     Cervical exam deferred        Extremities: Normal range of motion.  Edema: Mild pitting, slight indentation  Mental Status: Normal mood and affect. Normal behavior. Normal judgment and thought content.   Urinalysis: Urine Protein: Negative Urine Glucose: Negative  Assessment and Plan:  Pregnancy: G2P0101 at [redacted]w[redacted]d  Patient Active Problem List   Diagnosis Date Noted  . Group B Streptococcus carrier, +RV culture, currently pregnant 03/06/2015  . Hereditary disease in family possibly affecting fetus, affecting management of mother, antepartum condition or complication   . History of fetal anomaly in prior pregnancy, currently pregnant   . Supervision of other normal pregnancy, antepartum 09/22/2014    Term labor symptoms and general obstetric precautions including but not limited to vaginal bleeding, contractions, leaking of fluid and fetal movement were reviewed in  detail with the patient. Please refer to After Visit Summary for other counseling recommendations.  1 week f/u  Adam Phenix, MD

## 2015-03-12 NOTE — Progress Notes (Signed)
Urinalysis shows large leukocytes.  

## 2015-03-12 NOTE — Patient Instructions (Signed)

## 2015-03-18 ENCOUNTER — Encounter (HOSPITAL_COMMUNITY)
Admission: AD | Disposition: A | Discharge status: Home / Self Care | Payer: Self-pay | Source: Ambulatory Visit | Attending: Obstetrics & Gynecology

## 2015-03-18 ENCOUNTER — Inpatient Hospital Stay (HOSPITAL_COMMUNITY): Payer: Medicaid Other | Admitting: Anesthesiology

## 2015-03-18 ENCOUNTER — Inpatient Hospital Stay (HOSPITAL_COMMUNITY)
Admission: AD | Admit: 2015-03-18 | Discharge: 2015-03-21 | DRG: 766 | Disposition: A | Discharge status: Home / Self Care | Payer: Medicaid Other | Source: Ambulatory Visit | Attending: Obstetrics & Gynecology | Admitting: Obstetrics & Gynecology

## 2015-03-18 ENCOUNTER — Encounter (HOSPITAL_COMMUNITY): Payer: Self-pay

## 2015-03-18 DIAGNOSIS — O99820 Streptococcus B carrier state complicating pregnancy: Secondary | ICD-10-CM

## 2015-03-18 DIAGNOSIS — O99824 Streptococcus B carrier state complicating childbirth: Secondary | ICD-10-CM | POA: Diagnosis present

## 2015-03-18 DIAGNOSIS — IMO0001 Reserved for inherently not codable concepts without codable children: Secondary | ICD-10-CM

## 2015-03-18 DIAGNOSIS — O321XX0 Maternal care for breech presentation, not applicable or unspecified: Secondary | ICD-10-CM | POA: Diagnosis not present

## 2015-03-18 DIAGNOSIS — Z3A38 38 weeks gestation of pregnancy: Secondary | ICD-10-CM

## 2015-03-18 DIAGNOSIS — Z833 Family history of diabetes mellitus: Secondary | ICD-10-CM | POA: Diagnosis not present

## 2015-03-18 DIAGNOSIS — O326XX0 Maternal care for compound presentation, not applicable or unspecified: Secondary | ICD-10-CM | POA: Diagnosis not present

## 2015-03-18 DIAGNOSIS — O34219 Maternal care for unspecified type scar from previous cesarean delivery: Secondary | ICD-10-CM

## 2015-03-18 LAB — CBC
HCT: 36.9 % (ref 36.0–46.0)
Hemoglobin: 12.4 g/dL (ref 12.0–15.0)
MCH: 28.8 pg (ref 26.0–34.0)
MCHC: 33.6 g/dL (ref 30.0–36.0)
MCV: 85.6 fL (ref 78.0–100.0)
PLATELETS: 182 10*3/uL (ref 150–400)
RBC: 4.31 MIL/uL (ref 3.87–5.11)
RDW: 13.7 % (ref 11.5–15.5)
WBC: 11.5 10*3/uL — AB (ref 4.0–10.5)

## 2015-03-18 LAB — RPR: RPR: NONREACTIVE

## 2015-03-18 LAB — TYPE AND SCREEN
ABO/RH(D): A POS
ANTIBODY SCREEN: NEGATIVE

## 2015-03-18 SURGERY — Surgical Case
Anesthesia: Epidural

## 2015-03-18 MED ORDER — CHLOROPROCAINE HCL 3 % IJ SOLN
INTRAMUSCULAR | Status: AC
Start: 2015-03-18 — End: 2015-03-18
  Filled 2015-03-18: qty 20

## 2015-03-18 MED ORDER — PENICILLIN G POTASSIUM 5000000 UNITS IJ SOLR
5.0000 10*6.[IU] | Freq: Once | INTRAVENOUS | Status: DC
  Filled 2015-03-18: qty 5

## 2015-03-18 MED ORDER — SODIUM CHLORIDE 0.9 % IV SOLN
2.0000 g | Freq: Once | INTRAVENOUS | Status: AC
  Administered 2015-03-18: 2 g via INTRAVENOUS
  Filled 2015-03-18: qty 2000

## 2015-03-18 MED ORDER — OXYCODONE-ACETAMINOPHEN 5-325 MG PO TABS: 1.0000 | ORAL_TABLET | ORAL | Status: DC | PRN

## 2015-03-18 MED ORDER — WITCH HAZEL-GLYCERIN EX PADS: 1.0000 "application " | MEDICATED_PAD | CUTANEOUS | Status: DC | PRN

## 2015-03-18 MED ORDER — TERBUTALINE SULFATE 1 MG/ML IJ SOLN: 0.2500 mg | Freq: Once | INTRAMUSCULAR | Status: DC | PRN

## 2015-03-18 MED ORDER — OXYCODONE-ACETAMINOPHEN 5-325 MG PO TABS: 2.0000 | ORAL_TABLET | ORAL | Status: DC | PRN

## 2015-03-18 MED ORDER — IBUPROFEN 600 MG PO TABS
600.0000 mg | ORAL_TABLET | Freq: Four times a day (QID) | ORAL | Status: DC
  Administered 2015-03-19 – 2015-03-21 (×7): 600 mg via ORAL
  Filled 2015-03-18 (×7): qty 1

## 2015-03-18 MED ORDER — METHYLERGONOVINE MALEATE 0.2 MG/ML IJ SOLN: 0.2000 mg | INTRAMUSCULAR | Status: DC | PRN

## 2015-03-18 MED ORDER — OXYCODONE-ACETAMINOPHEN 5-325 MG PO TABS
1.0000 | ORAL_TABLET | ORAL | Status: DC | PRN
  Administered 2015-03-19 – 2015-03-21 (×4): 1 via ORAL
  Filled 2015-03-18 (×4): qty 1

## 2015-03-18 MED ORDER — KETOROLAC TROMETHAMINE 30 MG/ML IJ SOLN: 30.0000 mg | Freq: Four times a day (QID) | INTRAMUSCULAR | Status: DC | PRN

## 2015-03-18 MED ORDER — MEPERIDINE HCL 25 MG/ML IJ SOLN: 6.2500 mg | INTRAMUSCULAR | Status: DC | PRN

## 2015-03-18 MED ORDER — LANOLIN HYDROUS EX OINT: 1.0000 "application " | TOPICAL_OINTMENT | CUTANEOUS | Status: DC | PRN

## 2015-03-18 MED ORDER — LACTATED RINGERS IV SOLN
INTRAVENOUS | Status: DC
  Administered 2015-03-18: 08:00:00 via INTRAVENOUS
  Administered 2015-03-18: 125 mL/h via INTRAVENOUS

## 2015-03-18 MED ORDER — ACETAMINOPHEN 500 MG PO TABS
1000.0000 mg | ORAL_TABLET | Freq: Four times a day (QID) | ORAL | Status: AC
  Administered 2015-03-18: 1000 mg via ORAL
  Filled 2015-03-18: qty 2

## 2015-03-18 MED ORDER — ONDANSETRON HCL 4 MG/2ML IJ SOLN: 4.0000 mg | Freq: Three times a day (TID) | INTRAMUSCULAR | Status: DC | PRN

## 2015-03-18 MED ORDER — NALBUPHINE HCL 10 MG/ML IJ SOLN: 5.0000 mg | Freq: Once | INTRAMUSCULAR | Status: DC | PRN

## 2015-03-18 MED ORDER — ONDANSETRON HCL 4 MG/2ML IJ SOLN: 4.0000 mg | Freq: Four times a day (QID) | INTRAMUSCULAR | Status: DC | PRN

## 2015-03-18 MED ORDER — LACTATED RINGERS IV SOLN
INTRAVENOUS | Status: DC | PRN
  Administered 2015-03-18: 18:00:00 via INTRAVENOUS

## 2015-03-18 MED ORDER — LACTATED RINGERS IV SOLN
INTRAVENOUS | Status: DC
  Administered 2015-03-19: 04:00:00 via INTRAVENOUS

## 2015-03-18 MED ORDER — FLEET ENEMA 7-19 GM/118ML RE ENEM: 1.0000 | ENEMA | RECTAL | Status: DC | PRN

## 2015-03-18 MED ORDER — ACETAMINOPHEN 325 MG PO TABS: 650.0000 mg | ORAL_TABLET | ORAL | Status: DC | PRN

## 2015-03-18 MED ORDER — SIMETHICONE 80 MG PO CHEW: 80.0000 mg | CHEWABLE_TABLET | ORAL | Status: DC | PRN

## 2015-03-18 MED ORDER — OXYTOCIN BOLUS FROM INFUSION: 500.0000 mL | INTRAVENOUS | Status: DC

## 2015-03-18 MED ORDER — SIMETHICONE 80 MG PO CHEW
80.0000 mg | CHEWABLE_TABLET | ORAL | Status: DC
  Administered 2015-03-20 (×2): 80 mg via ORAL
  Filled 2015-03-18 (×2): qty 1

## 2015-03-18 MED ORDER — MIDAZOLAM HCL 2 MG/2ML IJ SOLN
INTRAMUSCULAR | Status: AC
  Filled 2015-03-18: qty 4

## 2015-03-18 MED ORDER — TERBUTALINE SULFATE 1 MG/ML IJ SOLN
INTRAMUSCULAR | Status: AC
  Administered 2015-03-18: 0.25 mg via SUBCUTANEOUS
  Filled 2015-03-18: qty 1

## 2015-03-18 MED ORDER — METHYLERGONOVINE MALEATE 0.2 MG/ML IJ SOLN
INTRAMUSCULAR | Status: DC | PRN
  Administered 2015-03-18: 0.2 mg via INTRAMUSCULAR

## 2015-03-18 MED ORDER — NALBUPHINE HCL 10 MG/ML IJ SOLN: 5.0000 mg | INTRAMUSCULAR | Status: DC | PRN

## 2015-03-18 MED ORDER — LIDOCAINE HCL (PF) 1 % IJ SOLN
INTRAMUSCULAR | Status: DC | PRN
  Administered 2015-03-18 (×2): 8 mL via EPIDURAL

## 2015-03-18 MED ORDER — DIBUCAINE 1 % RE OINT: 1.0000 "application " | TOPICAL_OINTMENT | RECTAL | Status: DC | PRN

## 2015-03-18 MED ORDER — TERBUTALINE SULFATE 1 MG/ML IJ SOLN
0.2500 mg | Freq: Once | INTRAMUSCULAR | Status: AC
  Administered 2015-03-18: 0.25 mg via SUBCUTANEOUS

## 2015-03-18 MED ORDER — MORPHINE SULFATE (PF) 0.5 MG/ML IJ SOLN
INTRAMUSCULAR | Status: AC
  Filled 2015-03-18: qty 100

## 2015-03-18 MED ORDER — DIPHENHYDRAMINE HCL 25 MG PO CAPS: 25.0000 mg | ORAL_CAPSULE | Freq: Four times a day (QID) | ORAL | Status: DC | PRN

## 2015-03-18 MED ORDER — SODIUM CHLORIDE 0.9 % IV SOLN
1.0000 g | Freq: Four times a day (QID) | INTRAVENOUS | Status: DC
  Filled 2015-03-18 (×3): qty 1000

## 2015-03-18 MED ORDER — SIMETHICONE 80 MG PO CHEW
80.0000 mg | CHEWABLE_TABLET | Freq: Three times a day (TID) | ORAL | Status: DC
  Administered 2015-03-19 – 2015-03-20 (×5): 80 mg via ORAL
  Filled 2015-03-18 (×6): qty 1

## 2015-03-18 MED ORDER — LIDOCAINE HCL (PF) 1 % IJ SOLN: 30.0000 mL | INTRAMUSCULAR | Status: DC | PRN

## 2015-03-18 MED ORDER — SCOPOLAMINE 1 MG/3DAYS TD PT72
MEDICATED_PATCH | TRANSDERMAL | Status: DC | PRN
  Administered 2015-03-18: 1 via TRANSDERMAL

## 2015-03-18 MED ORDER — TETANUS-DIPHTH-ACELL PERTUSSIS 5-2.5-18.5 LF-MCG/0.5 IM SUSP: 0.5000 mL | Freq: Once | INTRAMUSCULAR | Status: DC

## 2015-03-18 MED ORDER — OXYTOCIN 40 UNITS IN LACTATED RINGERS INFUSION - SIMPLE MED: 62.5000 mL/h | INTRAVENOUS | Status: DC

## 2015-03-18 MED ORDER — PRENATAL MULTIVITAMIN CH
1.0000 | ORAL_TABLET | Freq: Every day | ORAL | Status: DC
  Administered 2015-03-20: 1 via ORAL
  Filled 2015-03-18: qty 1

## 2015-03-18 MED ORDER — MENTHOL 3 MG MT LOZG: 1.0000 | LOZENGE | OROMUCOSAL | Status: DC | PRN

## 2015-03-18 MED ORDER — SCOPOLAMINE 1 MG/3DAYS TD PT72
MEDICATED_PATCH | TRANSDERMAL | Status: AC
  Filled 2015-03-18: qty 1

## 2015-03-18 MED ORDER — LIDOCAINE-EPINEPHRINE (PF) 2 %-1:200000 IJ SOLN
INTRAMUSCULAR | Status: DC | PRN
  Administered 2015-03-18 (×2): 5 mL via EPIDURAL

## 2015-03-18 MED ORDER — OXYTOCIN 40 UNITS IN LACTATED RINGERS INFUSION - SIMPLE MED
1.0000 m[IU]/min | INTRAVENOUS | Status: DC
  Filled 2015-03-18: qty 1000

## 2015-03-18 MED ORDER — MIDAZOLAM HCL 2 MG/2ML IJ SOLN
INTRAMUSCULAR | Status: DC | PRN
Start: 2015-03-18 — End: 2015-03-18
  Administered 2015-03-18: 2 mg via INTRAVENOUS

## 2015-03-18 MED ORDER — DIPHENHYDRAMINE HCL 25 MG PO CAPS: 25.0000 mg | ORAL_CAPSULE | ORAL | Status: DC | PRN

## 2015-03-18 MED ORDER — FENTANYL 2.5 MCG/ML BUPIVACAINE 1/10 % EPIDURAL INFUSION (WH - ANES)
14.0000 mL/h | INTRAMUSCULAR | Status: DC | PRN
  Administered 2015-03-18 (×2): 14 mL/h via EPIDURAL
  Filled 2015-03-18: qty 125

## 2015-03-18 MED ORDER — PENICILLIN G POTASSIUM 5000000 UNITS IJ SOLR
2.5000 10*6.[IU] | INTRAVENOUS | Status: DC
  Filled 2015-03-18 (×2): qty 2.5

## 2015-03-18 MED ORDER — PHENYLEPHRINE 40 MCG/ML (10ML) SYRINGE FOR IV PUSH (FOR BLOOD PRESSURE SUPPORT)
80.0000 ug | PREFILLED_SYRINGE | INTRAVENOUS | Status: DC | PRN
Start: 2015-03-18 — End: 2015-03-18
  Filled 2015-03-18 (×2): qty 20

## 2015-03-18 MED ORDER — MORPHINE SULFATE (PF) 0.5 MG/ML IJ SOLN
INTRAMUSCULAR | Status: DC | PRN
Start: 2015-03-18 — End: 2015-03-18
  Administered 2015-03-18: 4 mg via EPIDURAL

## 2015-03-18 MED ORDER — IBUPROFEN 600 MG PO TABS
600.0000 mg | ORAL_TABLET | Freq: Four times a day (QID) | ORAL | Status: DC | PRN
  Administered 2015-03-19: 600 mg via ORAL
  Filled 2015-03-18: qty 1

## 2015-03-18 MED ORDER — LACTATED RINGERS IV SOLN: 500.0000 mL | INTRAVENOUS | Status: DC | PRN

## 2015-03-18 MED ORDER — OXYTOCIN 40 UNITS IN LACTATED RINGERS INFUSION - SIMPLE MED: 62.5000 mL/h | INTRAVENOUS | Status: AC

## 2015-03-18 MED ORDER — SENNOSIDES-DOCUSATE SODIUM 8.6-50 MG PO TABS
2.0000 | ORAL_TABLET | ORAL | Status: DC
  Administered 2015-03-20 (×2): 2 via ORAL
  Filled 2015-03-18 (×2): qty 2

## 2015-03-18 MED ORDER — KETAMINE HCL 10 MG/ML IJ SOLN
INTRAMUSCULAR | Status: AC
  Filled 2015-03-18: qty 20

## 2015-03-18 MED ORDER — PROPOFOL 10 MG/ML IV BOLUS
INTRAVENOUS | Status: AC
  Filled 2015-03-18: qty 20

## 2015-03-18 MED ORDER — KETAMINE HCL 10 MG/ML IJ SOLN
INTRAMUSCULAR | Status: DC | PRN
  Administered 2015-03-18: 10 mg via INTRAVENOUS

## 2015-03-18 MED ORDER — PROMETHAZINE HCL 25 MG/ML IJ SOLN: 6.2500 mg | INTRAMUSCULAR | Status: DC | PRN

## 2015-03-18 MED ORDER — HYDROMORPHONE HCL 1 MG/ML IJ SOLN: 0.2500 mg | INTRAMUSCULAR | Status: DC | PRN

## 2015-03-18 MED ORDER — LACTATED RINGERS IV SOLN: INTRAVENOUS | Status: DC

## 2015-03-18 MED ORDER — CEFAZOLIN SODIUM-DEXTROSE 2-3 GM-% IV SOLR
INTRAVENOUS | Status: DC | PRN
  Administered 2015-03-18: 2 g via INTRAVENOUS

## 2015-03-18 MED ORDER — CEFAZOLIN SODIUM-DEXTROSE 2-3 GM-% IV SOLR
INTRAVENOUS | Status: AC
  Filled 2015-03-18: qty 50

## 2015-03-18 MED ORDER — DEXAMETHASONE SODIUM PHOSPHATE 4 MG/ML IJ SOLN
INTRAMUSCULAR | Status: DC | PRN
  Administered 2015-03-18: 4 mg via INTRAVENOUS

## 2015-03-18 MED ORDER — CITRIC ACID-SODIUM CITRATE 334-500 MG/5ML PO SOLN: 30.0000 mL | ORAL | Status: DC | PRN

## 2015-03-18 MED ORDER — KETOROLAC TROMETHAMINE 30 MG/ML IJ SOLN: 30.0000 mg | Freq: Once | INTRAMUSCULAR | Status: DC | PRN

## 2015-03-18 MED ORDER — MEPERIDINE HCL 25 MG/ML IJ SOLN
INTRAMUSCULAR | Status: AC
  Filled 2015-03-18: qty 1

## 2015-03-18 MED ORDER — SCOPOLAMINE 1 MG/3DAYS TD PT72
1.0000 | MEDICATED_PATCH | Freq: Once | TRANSDERMAL | Status: DC
  Filled 2015-03-18: qty 1

## 2015-03-18 MED ORDER — MEPERIDINE HCL 25 MG/ML IJ SOLN
INTRAMUSCULAR | Status: DC | PRN
  Administered 2015-03-18: 12.5 mg via INTRAVENOUS

## 2015-03-18 MED ORDER — DIPHENHYDRAMINE HCL 50 MG/ML IJ SOLN: 12.5000 mg | INTRAMUSCULAR | Status: DC | PRN

## 2015-03-18 MED ORDER — NALOXONE HCL 0.4 MG/ML IJ SOLN: 0.4000 mg | INTRAMUSCULAR | Status: DC | PRN

## 2015-03-18 MED ORDER — EPHEDRINE 5 MG/ML INJ
10.0000 mg | INTRAVENOUS | Status: DC | PRN
Start: 2015-03-18 — End: 2015-03-18

## 2015-03-18 MED ORDER — ZOLPIDEM TARTRATE 5 MG PO TABS: 5.0000 mg | ORAL_TABLET | Freq: Every evening | ORAL | Status: DC | PRN

## 2015-03-18 MED ORDER — SODIUM CHLORIDE 0.9 % IJ SOLN: 3.0000 mL | INTRAMUSCULAR | Status: DC | PRN

## 2015-03-18 MED ORDER — CHLOROPROCAINE HCL 3 % IJ SOLN
INTRAMUSCULAR | Status: DC | PRN
  Administered 2015-03-18: 20 mL

## 2015-03-18 MED ORDER — METHYLERGONOVINE MALEATE 0.2 MG/ML IJ SOLN
INTRAMUSCULAR | Status: AC
  Filled 2015-03-18: qty 1

## 2015-03-18 MED ORDER — LACTATED RINGERS IV SOLN
40.0000 [IU] | INTRAVENOUS | Status: DC | PRN
  Administered 2015-03-18: 40 [IU] via INTRAVENOUS

## 2015-03-18 MED ORDER — CITRIC ACID-SODIUM CITRATE 334-500 MG/5ML PO SOLN
30.0000 mL | ORAL | Status: DC | PRN
  Administered 2015-03-18: 30 mL via ORAL
  Filled 2015-03-18 (×2): qty 15

## 2015-03-18 MED ORDER — METHYLERGONOVINE MALEATE 0.2 MG PO TABS: 0.2000 mg | ORAL_TABLET | ORAL | Status: DC | PRN

## 2015-03-18 MED ORDER — ONDANSETRON HCL 4 MG/2ML IJ SOLN
INTRAMUSCULAR | Status: AC
  Filled 2015-03-18: qty 2

## 2015-03-18 MED ORDER — NALOXONE HCL 1 MG/ML IJ SOLN
1.0000 ug/kg/h | INTRAVENOUS | Status: DC | PRN
  Filled 2015-03-18: qty 2

## 2015-03-18 MED ORDER — ONDANSETRON HCL 4 MG/2ML IJ SOLN
INTRAMUSCULAR | Status: DC | PRN
  Administered 2015-03-18: 4 mg via INTRAVENOUS

## 2015-03-18 SURGICAL SUPPLY — 27 items
CLAMP CORD UMBIL (MISCELLANEOUS) IMPLANT
CONTAINER PREFILL 10% NBF 15ML (MISCELLANEOUS) IMPLANT
DRAPE SHEET LG 3/4 BI-LAMINATE (DRAPES) IMPLANT
DRSG OPSITE POSTOP 4X10 (GAUZE/BANDAGES/DRESSINGS) ×3 IMPLANT
DURAPREP 26ML APPLICATOR (WOUND CARE) ×3 IMPLANT
ELECT REM PT RETURN 9FT ADLT (ELECTROSURGICAL) ×3
ELECTRODE REM PT RTRN 9FT ADLT (ELECTROSURGICAL) ×1 IMPLANT
EXTRACTOR VACUUM M CUP 4 TUBE (SUCTIONS) IMPLANT
EXTRACTOR VACUUM M CUP 4' TUBE (SUCTIONS)
GLOVE BIOGEL PI IND STRL 6.5 (GLOVE) ×1 IMPLANT
GLOVE BIOGEL PI INDICATOR 6.5 (GLOVE) ×2
GLOVE SURG SS PI 6.0 STRL IVOR (GLOVE) ×3 IMPLANT
GOWN STRL REUS W/TWL LRG LVL3 (GOWN DISPOSABLE) ×6 IMPLANT
KIT ABG SYR 3ML LUER SLIP (SYRINGE) IMPLANT
LIQUID BAND (GAUZE/BANDAGES/DRESSINGS) ×3 IMPLANT
NEEDLE HYPO 25X5/8 SAFETYGLIDE (NEEDLE) IMPLANT
NS IRRIG 1000ML POUR BTL (IV SOLUTION) ×3 IMPLANT
PACK C SECTION WH (CUSTOM PROCEDURE TRAY) ×3 IMPLANT
PAD OB MATERNITY 4.3X12.25 (PERSONAL CARE ITEMS) ×3 IMPLANT
PENCIL SMOKE EVAC W/HOLSTER (ELECTROSURGICAL) ×3 IMPLANT
RTRCTR C-SECT PINK 25CM LRG (MISCELLANEOUS) IMPLANT
SEPRAFILM MEMBRANE 5X6 (MISCELLANEOUS) IMPLANT
SUT PLAIN 0 NONE (SUTURE) IMPLANT
SUT VIC AB 0 CT1 36 (SUTURE) ×12 IMPLANT
SUT VIC AB 4-0 KS 27 (SUTURE) ×3 IMPLANT
TOWEL OR 17X24 6PK STRL BLUE (TOWEL DISPOSABLE) ×3 IMPLANT
TRAY FOLEY CATH SILVER 14FR (SET/KITS/TRAYS/PACK) ×3 IMPLANT

## 2015-03-18 NOTE — MAU Note (Signed)
Notified MD patient G2P1 [redacted]w[redacted]d here for labor, contractions every 3-4, cervix 5/90/-1 BBOW, received admit orders.

## 2015-03-18 NOTE — Anesthesia Preprocedure Evaluation (Addendum)
Anesthesia Evaluation  Patient identified by MRN, date of birth, ID band Patient awake    Reviewed: Allergy & Precautions, H&P , NPO status , Patient's Chart, lab work & pertinent test results  Airway Mallampati: I  TM Distance: >3 FB Neck ROM: full    Dental no notable dental hx.    Pulmonary neg pulmonary ROS,    Pulmonary exam normal        Cardiovascular negative cardio ROS Normal cardiovascular exam     Neuro/Psych negative neurological ROS  negative psych ROS   GI/Hepatic negative GI ROS, Neg liver ROS,   Endo/Other  negative endocrine ROS  Renal/GU negative Renal ROS     Musculoskeletal   Abdominal Normal abdominal exam  (+)   Peds  Hematology negative hematology ROS (+)   Anesthesia Other Findings   Reproductive/Obstetrics (+) Pregnancy                             Anesthesia Physical Anesthesia Plan  ASA: II  Anesthesia Plan: Epidural   Post-op Pain Management:    Induction:   Airway Management Planned:   Additional Equipment:   Intra-op Plan:   Post-operative Plan:   Informed Consent: I have reviewed the patients History and Physical, chart, labs and discussed the procedure including the risks, benefits and alternatives for the proposed anesthesia with the patient or authorized representative who has indicated his/her understanding and acceptance.     Plan Discussed with:   Anesthesia Plan Comments: (For stat C/S for prolapsed cord with epidural dosed.)       Anesthesia Quick Evaluation

## 2015-03-18 NOTE — Progress Notes (Signed)
Charge nurse and midwife aware of fetal tracing.  Charge nurse notified attending Despina Hidden) to evaluate in person.  Informed of decels lasting 90 seconds, baseline 170-180 with minimal to no variability. MD states will be on unit shortly.

## 2015-03-18 NOTE — H&P (Signed)
OBSTETRIC ADMISSION HISTORY AND PHYSICAL  Melanie Moore is a 24 y.o. female G50P0101 with IUP at [redacted]w[redacted]d by 10wk [place] presenting for active labor. Contractions started this AM. She reports +FMs, No LOF, no VB, no blurry vision, headaches or peripheral edema, and RUQ pain.  She plans on breast feeding. She requests Mirena for birth control.  Dating: By Ines Bloomer --->  Estimated Date of Delivery: 03/28/15  Sono:   Anatomy- complete at 21wk5d, wnl   Prenatal History/Complications:  Clinic  [facility] Prenatal Labs  Dating  10.6 week [place] 09/05/14 Blood type: A/POS/-- (04/11 1420)   Genetic Screen Quad:        NIPS: Antibody:NEG (04/11 1420)  Anatomic [place] normal Rubella: 4.91 (04/11 1420)immune  GTT Early:        87       Third trimester: 3hr GTT: 85, 164, 81, 104 RPR: NON REAC (04/11 1420)  Flu vaccine 09/22/14, 02/19/15 HBsAg: NEGATIVE (04/11 1420)   TDaP vaccine  01/15/15                    HIV: NONREACTIVE (04/11 1420)   GBS   POS             GBS: POS  Contraception  Mirena ZOX:[id number]  Baby Food  breast   Circumcision  Girl   Pediatrician  [facility]   Support Person  Rowland Lathe    Past Medical History: Past Medical History  Diagnosis Date  . Medical history non-contributory   . LGSIL (low grade squamous intraepithelial lesion) on Pap smear 11/19/2012    Repeat pap in one year as per ASCCP guidelines    Past Surgical History: Past Surgical History  Procedure Laterality Date  . Eye surgery  2008    sty removed both eyes    Obstetrical History: OB History    Gravida Para Term Preterm AB TAB SAB Ectopic Multiple Living   Social History: Social History   Social History  . Marital Status: Single    Spouse Name: N/A  . Number of Children: N/A  . Years of Education: N/A   Social History Main Topics  . Smoking status: Never Smoker   . Smokeless tobacco: Never Used  . Alcohol Use: No     Comment: socially before pregnancy  . Drug Use: No  . Sexual Activity:  Yes    Birth Control/ Protection: None   Other Topics Concern  . None   Social History Narrative    Family History: Family History  Problem Relation Age of Onset  . Diabetes Father   . Hyperlipidemia Father   . Diabetes Maternal Grandfather   . Diabetes Paternal Grandmother   . Diabetes Paternal Grandfather   . Depression Mother   . Fibromyalgia Mother     Allergies: No Known Allergies  Prescriptions prior to admission  Medication Sig Dispense Refill Last Dose  . Prenatal Vit-Fe Fumarate-FA (PRENATAL MULTIVITAMIN) TABS Take 1 tablet by mouth at bedtime.   Taking     Review of Systems   All systems reviewed and negative except as stated in HPI  Blood pressure 123/86, pulse 71, temperature 97.8 F (36.6 C), resp. rate 18, height  (1.651 m), weight 186 lb (84.369 kg), last menstrual period 07/02/2014. General appearance: alert, cooperative and appears stated age Lungs: clear to auscultation bilaterally Heart: regular rate and rhythm Abdomen: soft, non-tender; bowel sounds normal Extremities:  Homans sign is negative, no sign of DVT Presentation: cephalic Fetal monitoringBaseline: 130 bpm, Variability: Good {> 6 bpm) and Accelerations: Reactive Uterine activityFrequency: Every 3-4 minutes Dilation: 5 Effacement (%): 90 Station: -1   Prenatal labs: ABO, Rh: A/POS/-- (04/11 1420) Antibody: NEG (04/11 1420) Rubella:  5.96 - Immune (10/16/12) RPR: NON REAC (08/04 1330)  HBsAg: NEGATIVE (04/11 1420)  HIV: NONREACTIVE (08/04 1330)  GBS: Positive (09/22 0000)  1 hr Glucola failed, passed 3hr Genetic screening  NA Anatomy US wnl  Prenatal Transfer Tool  Maternal Diabetes: No- failed 3rd trimester screening but passed 3hr Genetic Screening: Normal Maternal Ultrasounds/Referrals: Normal- Previous child with chromosome 8 microdeletion with unilateral kidney, this pregnancy anatomy wnl Fetal Ultrasounds or other Referrals:  None Maternal Substance Abuse:   No Significant Maternal Medications:  None Significant Maternal Lab Results: Lab values include: Group B Strep positive  No results found for this or any previous visit (from the past 24 hour(s)).  Patient Active Problem List   Diagnosis Date Noted  . Group B Streptococcus carrier, +RV culture, currently pregnant 03/06/2015  . Hereditary disease in family possibly affecting fetus, affecting management of mother, antepartum condition or complication   . History of fetal anomaly in prior pregnancy, currently pregnant   . Supervision of other normal pregnancy, antepartum 09/22/2014    Assessment: Melanie Moore is a 24 y.o. G2P0101 at [redacted]w[redacted]d here for Active labor  #Labor:Expectant managment #Pain: Epidural #FWB: Cat I #ID:  GBS Pos- Ampicillin #MOF: Breast #MOC:Mirena #Circ: n/a female  Melanie Moore 03/18/2015, 7:44 AM

## 2015-03-18 NOTE — Transfer of Care (Signed)
Immediate Anesthesia Transfer of Care Note  Patient: Melanie Moore  Procedure(s) Performed: Procedure(s): CESAREAN SECTION (N/A)  Patient Location: PACU  Anesthesia Type:Epidural  Level of Consciousness: awake, alert , oriented and patient cooperative  Airway & Oxygen Therapy: Patient Spontanous Breathing  Post-op Assessment: Report given to RN and Post -op Vital signs reviewed and stable  Post vital signs: Reviewed and stable  Last Vitals:  Filed Vitals:   03/18/15 1807  BP:   Pulse: 107  Temp:   Resp:     Complications: No apparent anesthesia complications

## 2015-03-18 NOTE — Progress Notes (Signed)
Patient comfortable with epidural and noted have fetal malpresentation. Discussed primary cesarean section or attempt of external cephalic version. Risks and alternatives were explained. Patient verbalized understanding and wished to proceed with external cephalic version. Fetal tracing category I prior to the procedure.  External Cephalic Version  Preprocedure Diagnosis:  24 yo G2P0101 at [redacted]w[redacted]d gestational age with breech presentation  Post-procedure Diagnosis: 24 G2P0101 at [redacted]w[redacted]d gestational age with cephalic presentation  Procedure:  External cephalic version  Procedure in detail:   The patient was brought to Labor and Delivery where a reactive fetal heart tracing was obtained. The patient was noted to have irregular contractions 3-5 minutes. She was given 1 dose of subcutaneous terbutaline which resolved her contraction. A bedside ultrasound was performed which revealed single intrauterine pregnancy and breech presentation. There was noted to be adequate fluid. Using manual pressure, the breech was manipulated in a forward roll fashion until a vertex presentation was obtained. Fetal heart tones were checked intermittently during the procedure and were noted to be reassuring. Following successful external cephalic version, the patient was placed on continuous external fetal monitoring. She was noted to have a reassuring and reactive tracing following the external cephalic version. AROM was performed with clear fluids. Anticipate NSVD

## 2015-03-18 NOTE — Progress Notes (Signed)
Patient ID: Melanie Moore, female   DOB: 1990/07/16, 24 y.o.   MRN: [id number]  Rechecked cervix due to variable decels persisting.  Good variability between with baseline remaining at 150  UCs every 3-4 min. Pitocin never started due to variables  Consulted Dr Despina Hidden regarding persistent compound presentation Head is palpable at 0 to -1 station  ? Lip of cervix anteriorly.  Dr Despina Hidden will come and evaluate patient.

## 2015-03-18 NOTE — Anesthesia Procedure Notes (Signed)
Epidural Patient location during procedure: OB Start time: 03/18/2015 10:02 AM End time: 03/18/2015 10:06 AM  Staffing Anesthesiologist: Leilani Able Performed by: anesthesiologist   Preanesthetic Checklist Completed: patient identified, surgical consent, pre-op evaluation, timeout performed, IV checked, risks and benefits discussed and monitors and equipment checked  Epidural Patient position: sitting Prep: site prepped and draped and DuraPrep Patient monitoring: continuous pulse ox and blood pressure Approach: midline Location: L3-L4 Injection technique: LOR air  Needle:  Needle type: Tuohy  Needle gauge: 17 G Needle length: 9 cm and 9 Needle insertion depth: 5 cm cm Catheter type: closed end flexible Catheter size: 19 Gauge Catheter at skin depth: 10 cm Test dose: negative and Other  Assessment Sensory level: T9 Events: blood not aspirated, injection not painful, no injection resistance, negative IV test and no paresthesia  Additional Notes Reason for block:procedure for pain

## 2015-03-18 NOTE — Op Note (Signed)
Preoperative diagnosis:  1.  Intrauterine pregnancy at [redacted]w[redacted]d  weeks gestation                                         2.  Compound presentation                                         3.  Cord prolapse, probable interval funic presentation   Postoperative diagnosis:  Same as above  Procedure:  Primary cesarean section  Surgeon:  Lazaro Arms MD  Assistant:    Anesthesia: Spinal  Findings:  I was called to assess the patient due to the compound presentation and some question about the cervical dilation status.  The patient however then began to have deep variables which was a significant change.  i came in to assess the patient at about 1755 or so.  The patient was in hands knees position.  i confirmed the FHR pattern.  i examined the patient and the cervix was about 8 cm and I could feel 2 feet and then a hand.  It took me a couple of minutes to figure out the small parts which were in the vagina and as I didi this the cord became palpable as well.  My impression is that it "slipped" down during the exam and manipulation.  I think the deep variables were due to the cord occultly prolapsing to the head level and,  Then, past during my exam> at that time approximately 1807 or so I called a code caesarean.  Over a low transverse incision was delivered a viable female with Apgars of 2 and 5 and 7 weighing 7 lbs. 10 oz. Uterus, tubes and ovaries were all normal.  There were no other significant findings  Description of operation:  Patient was taken to the operating room emergently.  Anesthesia had been dosing up the epidural.  I did a T10 test and the patient tolerated it.  . she was prepped and draped in usual sterile fashion and a Foley catheter  had previously been placed. A skin incision was made at 1818 and carried down shaply to the rectus fascia.   The fascia was taken off the muscles both superiorly and without difficulty. The muscles were divided.  The peritoneal cavity was entered.  Bladder  blade was placed, no bladder flap was created.  A low transverse hysterotomy incision was made and delivered a viable female  infant at 37 with Apgars of 2 and 5 and 7 weighing7 lbs 10 oz.  Cord pH was obtained and was 7.35. The uterus was exteriorized. It was closed in 2 layers, the first being a running interlocking layer and the second being an imbricating layer using 0 monocryl on a CTX needle. There was good resulting hemostasis. The uterus tubes and ovaries were all normal. Peritoneal cavity was irrigated vigorously. The muscles and peritoneum were reapproximated loosely. The fascia was closed using 0 Vicryl in running fashion. Subcutaneous tissue was made hemostatic and irrigated. The skin was closed using 4-0 Vicryl on a Keith needle in a subcuticular fashion. liquiban was placed for additional wound integrity and to serve as a barrier. Blood loss for the procedure was 750 cc. The patient received  Ancef prophylactically. The patient was taken to the recovery room in good  stable condition with all counts being correct x3.  EBL 750 cc  EURE,LUTHER H 03/18/2015 7:06 PM

## 2015-03-18 NOTE — Progress Notes (Signed)
Patient ID: Melanie Moore, female   DOB: 1991-03-02, 24 y.o.   MRN: [id number] RN was getting ready to start Pitocin on patient and noted the fetus having variable decelerations  Baseline heart beat was 145-150 with variables with contractions Dr Jolayne Panther updated, stated we could start pitocin Compound presentation persists.  Vertex palpable at 0 station with hands in front of vertex.  Per Dr Jolayne Panther, continue to observe and wait for descent of baby in second stage

## 2015-03-18 NOTE — Progress Notes (Signed)
Labor Progress Note  Melanie Moore is a 24 y.o. G2P0101 at [redacted]w[redacted]d  admitted for active labor  S: Pain of contractions increasing. Denies PIH symptoms (HA, scotomata, RUQ pain).  O:  BP 118/69 mmHg  Pulse 73  Temp(Src) 98.4 F (36.9 C) (Oral)  Resp 20  Ht  (1.651 m)  Wt 84.369 kg (186 lb)  BMI 30.95 kg/m2  LMP 07/02/2014 (Approximate)     FHT:  FHR: 140 bpm, variability: moderate,  accelerations:  Present,  decelerations:  Absent UC:   regular, every 3-4 minutes SVE:   Dilation: 6.5 Effacement (%): 90 Station: -1 Exam by:: Neal Dy, RN Membranes: intact  Labs: Lab Results  Component Value Date   WBC 11.5* 03/18/2015   HGB 12.4 03/18/2015   HCT 36.9 03/18/2015   MCV 85.6 03/18/2015   PLT 182 03/18/2015    Assessment / Plan: 24 y.o. G2P0101 [redacted]w[redacted]d in early/active labor Spontaneous labor, progressing normally  Labor: Progressing normally; Anticipate AROM once epidural placed. Fetal Wellbeing:  Category I Pain Control:  Epidural Anticipated MOD:  NSVD  Expectant management  Dani Gobble, PGY-1 [facility]

## 2015-03-18 NOTE — MAU Note (Signed)
Pt presents to MAU with complaints of contractions that started around 3 this morning with bloody show. Denies any LOF

## 2015-03-18 NOTE — Anesthesia Postprocedure Evaluation (Signed)
  Anesthesia Post-op Note  Patient: Melanie Moore  Procedure(s) Performed: Procedure(s) (LRB): CESAREAN SECTION (N/A)  Patient Location: PACU  Anesthesia Type: Epidural  Level of Consciousness: awake and alert   Airway and Oxygen Therapy: Patient Spontanous Breathing  Post-op Pain: mild  Post-op Assessment: Post-op Vital signs reviewed, Patient's Cardiovascular Status Stable, Respiratory Function Stable, Patent Airway and No signs of Nausea or vomiting  Last Vitals:  Filed Vitals:   03/18/15 2004  BP:   Pulse: 83  Temp:   Resp: 24    Post-op Vital Signs: stable   Complications: No apparent anesthesia complications

## 2015-03-18 NOTE — Progress Notes (Signed)
Patient ID: Melanie Moore, female   DOB: 08-02-1990, 24 y.o.   MRN: [id number] Doing well.   FHR reassuring UCs every 2-4 min  Dilation: 7 Effacement (%): 90 Station: -1 Presentation: Complete Breech Exam by:: Denesia Donelan,CNM  Membranes bulging tightly Unable to feel presenting part  Bedside US done Fetus is breech  Dr Jolayne Panther notified She will come evaluate the patient

## 2015-03-18 NOTE — Progress Notes (Signed)
Late entry Dr. Despina Hidden at bedside examining patient noted cord prolapsed, proceeded to ask for FSE to get fetal heart rate due to unable to locate fetal heart rate using external cardio.  51 Dr. Despina Hidden called code cesarean unable to determine fetal heart tones.  Several nurses at bedside to help.  RN on bed to hold head off prolapsed cord via vaginal exam.  Patient transported to OR.

## 2015-03-19 ENCOUNTER — Encounter: Payer: Medicaid Other | Admitting: Obstetrics & Gynecology

## 2015-03-19 ENCOUNTER — Encounter (HOSPITAL_COMMUNITY): Payer: Self-pay | Admitting: Advanced Practice Midwife

## 2015-03-19 LAB — CBC
HEMATOCRIT: 27.6 % — AB (ref 36.0–46.0)
Hemoglobin: 9.1 g/dL — ABNORMAL LOW (ref 12.0–15.0)
MCH: 28.3 pg (ref 26.0–34.0)
MCHC: 33 g/dL (ref 30.0–36.0)
MCV: 86 fL (ref 78.0–100.0)
Platelets: 144 10*3/uL — ABNORMAL LOW (ref 150–400)
RBC: 3.21 MIL/uL — AB (ref 3.87–5.11)
RDW: 13.8 % (ref 11.5–15.5)
WBC: 20.4 10*3/uL — AB (ref 4.0–10.5)

## 2015-03-19 LAB — GLUCOSE, CAPILLARY: GLUCOSE-CAPILLARY: 105 mg/dL — AB (ref 65–99)

## 2015-03-19 NOTE — Anesthesia Postprocedure Evaluation (Signed)
  Anesthesia Post-op Note  Patient: Melanie Moore  Procedure(s) Performed: Procedure(s): CESAREAN SECTION (N/A)  Patient Location: [facility] Unit  Anesthesia Type:Epidural  Level of Consciousness: awake, alert , oriented and patient cooperative  Airway and Oxygen Therapy: Patient Spontanous Breathing  Post-op Pain: mild  Post-op Assessment: Post-op Vital signs reviewed, Patient's Cardiovascular Status Stable, Respiratory Function Stable, Patent Airway, No headache, No backache and Patient able to bend at knees  Post-op Vital Signs: Reviewed and stable  Last Vitals:  Filed Vitals:   03/19/15 0730  BP: 116/73  Pulse: 65  Temp: 36.9 C  Resp: 18    Complications: No apparent anesthesia complications

## 2015-03-19 NOTE — Progress Notes (Signed)
Post Op Day 1  Subjective:  Melanie Moore is a 24 y.o. G2P1101 [redacted]w[redacted]d s/p pLTCS for cord prolapse and decels.  No acute events today or overnight.  Pt denies problems with ambulating, voiding or po intake.  She denies nausea or vomiting.  Pain is moderately controlled.  She has had flatus. She has not had bowel movement.  Lochia Small.  Plan for birth control is IUD.  Method of Feeding: Breast (baby is currently in NICU)  Objective: BP 109/52 mmHg  Pulse 65  Temp(Src) 98.4 F (36.9 C) (Oral)  Resp 18  Ht  (1.651 m)  Wt 81.647 kg (180 lb)  BMI 29.95 kg/m2  SpO2 100%  LMP 07/02/2014 (Approximate)  Breastfeeding? Unknown  Physical Exam:  General: alert, cooperative and no distress Lochia:normal flow Chest: CTAB Heart: RRR no m/r/g Abdomen: +BS, soft, nontender, fundus firm at/below umbilicus Surgical incision: no surrounding erythema or significant bleeding DVT Evaluation: No evidence of DVT seen on physical exam. Extremities: minimal/no edema   Recent Labs  03/18/15 0815 03/19/15 0512  HGB 12.4 9.1*  HCT 36.9 27.6*    Assessment/Plan:  ASSESSMENT: Melanie Moore is a 24 y.o. G2P1101 [redacted]w[redacted]d pod #1 s/p NSVD/VAVD/FAVD doing well.   Lactation consult, Social Work consult and Contraception IUD   LOS: 1 day   Mickie Hillier 03/19/2015, 11:34 PM

## 2015-03-19 NOTE — Addendum Note (Signed)
Addendum  created 03/19/15 0810 by Yolonda Kida, CRNA   Modules edited: Notes Section   Notes Section:  File: [id number]

## 2015-03-19 NOTE — Lactation Note (Signed)
This note was copied from the chart of Melanie Moore. Lactation Consultation Note  Patient Name: Melanie Moore [id number] Date: 03/19/2015 Reason for consult: Initial assessment;NICU baby   Initial consult at 52 hours old with mom of NICU baby; mom is a P2 with 3 months BF experience with first child.  Infant GA 38.4; BW 7 lbs, 10.1 oz.   Infant was intubated shortly after birth with Apgars of 2/5/7.  Mom reports pumping twice in past 12 hours and doing STS with baby in NICU.   LC reviewed NICU booklet, encouraged pumping every 2 hours during the day and at least once at night for a minimum of 8x/day. Reviewed hand expression with observation of several drops of colostrum and return demonstration from mom.  Small collection containers given with yellow colostrum stickers and curved-tip syringe.   Mom is using #27 flanges.  Taught mom to pump on preemie setting with 3-4 teardrops and using hands-on pumping method with hand expression at end of pumping session.   As mom was pumping right nipple got bigger and appeared to be rubbing on neck of flange.  LC gave mom #30 flanges d/t observation with change of size of nipple as she pumped and mom stated she used larger flanges with last child. LC did not get to see mom pump with #30 flanges after retrieving larger size since mom was working on hand expression and had put pump away.  Discussed proper flange fit and potential need to use 2 different sizes based on nipples size with pumping.   Mom stated she does not have [place] but has a medela Pump In Style DEBP at home she plans to use after discharge.  LC discussed with mom how the pump kit parts will fit her pump and the option to use pumping rooms in NICU when she visits her baby.   Encouraged mom to keep pumping log in NICU booklet.  Encouraged to call for assistance as needed.    Maternal Data Formula Feeding for Exclusion: Yes Reason for exclusion: Admission to Intensive Care Unit (ICU)  post-partum Has patient been taught Hand Expression?: Yes (with return demonstration and observation of colostrum) Does the patient have breastfeeding experience prior to this delivery?: Yes  Lactation Tools Discussed/Used WIC Program: No Pump Review: Setup, frequency, and cleaning;Milk Storage Initiated by:: RN   Consult Status Consult Status: Follow-up Date: 03/20/15 Follow-up type: In-patient    Melanie Moore 03/19/2015, 1:56 PM

## 2015-03-19 NOTE — Clinical Social Work Maternal (Signed)
CLINICAL SOCIAL WORK MATERNAL/CHILD NOTE  Patient Details  Name: Melanie Moore MRN: [id number] Date of Birth: 05/08/1991  Date:  03/19/2015  Clinical Social Worker Initiating Note:  Samuell Knoble E. Brigitte Pulse, [place] Date/ Time Initiated:  03/19/15/1440     Child's Name:  Melanie Moore   Legal Guardian:   (Parents: Karolee Ohs and Sherlean Foot)   Need for Interpreter:  None   Date of Referral:        Reason for Referral:   (No referral-NICU admission)   Referral Source:      Address:  [address], [place], [place] [address]  Phone number:  [phone]   Household Members:  Significant Other, Minor Children (Couple has one other child: Dylan, age 26)   Natural Supports (not living in the home):  Immediate Family, Extended Family, Friends (MOB reports having a great support system.)   Professional Supports:     Employment:     Type of Work:  (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  FOB works for [employer].)   Education:      Pensions consultant:  [employer]   Other Resources:      Cultural/Religious Considerations Which May Impact Care: None stated  Strengths:  Ability to meet basic needs , Compliance with medical plan    Risk Factors/Current Problems:  None   Cognitive State:  Alert , Linear Thinking , Goal Oriented , Insightful    Mood/Affect:  Calm , Comfortable , Relaxed , Interested    CSW Assessment: CSW met with MOB in her third floor room/305 introduce services, offer support and complete assessment due to baby's admission to NICU at 38.4 weeks.  MOB was pleasant and welcoming.  She had numerous visitors with her, but stated CSW could talk with her openly now.  CSW asked if MOB would be willing to share her birth story.  She seemed appreciative of the opportunity and space to share.  MOB reported that she and baby tolerated the external cephalic version well.  She states that FOB had just left the hospital because they were told that things would be a while.   MOB states 15 minutes later a doctor came in to check her and identified the cord prolapse.  She states she felt very scary, but was thankful that every one jumped into action as quickly as they did.  As MOB spoke about her birth experience, she began to visibly tremble.  She states, "it freaks me out just thinking about it."  CSW validated her feelings of fear and provided awareness of the potential for PTSD after a traumatic delivery.  CSW explained benefits of talking about a processing her feelings related to her experience, and how this may prove to be beneficial months/years after the experience.  FOB entered the room and also spoke openly about his experience.  Parents report that FOB arrived back to the hospital prior to baby's birth, for which they are thankful. MOB spoke about her experience with the birth of her first child, Melanie Moore, who turned 1 in September.  She states he was delivered at [facility], but quickly transferred to [facility] because he was born with only one kidney, and the kidney that he has had cysts on it.  She states they continue to take him to [facility] for follow up every month.  She states he has developmental delays and that he receives services through the [facility].  She states her family has been a huge support for her.  CSW provided education on perinatal mood disorders and encouraged MOB to speak with CSW and or her doctor if she has concerns about her emotional/mental health at any time.  CSW also provided education regarding safe sleep/SIDS risks.  MOB reports that baby will sleep in either a crib or bassinet at all times.  Parents report having all needed supplies for infant, but could use diapers.  CSW informed them of resources through [employer] and made referral.   CSW provided contact information and explained ongoing support services offered by NICU CSW.  CSW has no social concerns at this time.  Parents appeared very appreciative of the  visit.  CSW Plan/Description:  Information/Referral to [employer] , Psychosocial Support and Ongoing Assessment of Needs, Patient/Family Education     Alphonzo Cruise, [place] 03/19/2015, 4:04 PM

## 2015-03-20 NOTE — Lactation Note (Signed)
This note was copied from the chart of Melanie Mallika Sanmiguel. Lactation Consultation Note  Patient Name: Melanie Moore RUEAV'W Date: 03/20/2015 Reason for consult: Follow-up assessment  NICU baby 17 hours old. Mom reports that she has not been pumping every 2-3 hours. Discussed rationale for pumping 8 times/24 hours for 15 minutes, and enc pumping followed by hand expression. Mom states that she is comfortable with hand expression, and has taken EBM to baby in NICU. Mom states that she has had baby STS several times as well. Mom hopes to nuzzle at breast today as well. Mom states that she has a personal DEBP at home.  Maternal Data    Feeding Feeding Type: Formula Nipple Type: Slow - flow Length of feed: 20 min  LATCH Score/Interventions                      Lactation Tools Discussed/Used     Consult Status Consult Status: Follow-up Date: 03/21/15 Follow-up type: In-patient    Melanie Moore 03/20/2015, 1:54 PM

## 2015-03-20 NOTE — Progress Notes (Signed)
Post Op Day 2  Subjective:  Melanie Moore is a 24 y.o. G2P1101 [redacted]w[redacted]d s/p pLTCS for cord prolapse and decels.  No acute events overnight.  Pt denies problems with ambulating, voiding or po intake.  She denies nausea or vomiting.  Pain is moderately controlled.  She has had flatus. She has not had bowel movement.  Lochia Small.  Plan for birth control is IUD.  Method of Feeding: Breast  Objective: BP 109/52 mmHg  Pulse 65  Temp(Src) 98.4 F (36.9 C) (Oral)  Resp 18  Ht  (1.651 m)  Wt 81.647 kg (180 lb)  BMI 29.95 kg/m2  SpO2 100%  LMP 07/02/2014 (Approximate)  Breastfeeding? Unknown  Physical Exam:  General: alert, cooperative and no distress Lochia:normal flow Chest: CTAB Heart: RRR no m/r/g Abdomen: +BS, soft, mild tenderness to deep palpation Uterine Fundus: firm at/below umbilicus DVT Evaluation: No evidence of DVT seen on physical exam. Extremities: No LE edema   Recent Labs  03/18/15 0815 03/19/15 0512  HGB 12.4 9.1*  HCT 36.9 27.6*    Assessment/Plan:  ASSESSMENT: Lenka Zhao is a 24 y.o. G2P1101 [redacted]w[redacted]d pod#2 s/p pLTCS doing well. Baby still in NICU for elevated WBC, cultures pending.   Plan for discharge tomorrow, Breastfeeding and Contraception IUD   LOS: 2 days   Jamelle Haring 03/20/2015, 9:02 AM

## 2015-03-21 MED ORDER — OXYCODONE-ACETAMINOPHEN 5-325 MG PO TABS: 1.0000 | ORAL_TABLET | ORAL | Status: DC | PRN

## 2015-03-21 MED ORDER — IBUPROFEN 600 MG PO TABS: 600.0000 mg | ORAL_TABLET | Freq: Four times a day (QID) | ORAL | Status: DC | PRN

## 2015-03-21 NOTE — Plan of Care (Signed)
Problem: Phase I Progression Outcomes Goal: Other Phase I Outcomes/Goals Outcome: Completed/Met Date Met:  03/21/15 Passing flatus

## 2015-03-21 NOTE — Discharge Instructions (Signed)
Melanie Moore, congratulations on your new baby!  For pain, I have prescribed percocet and ibuprofen. Below are instructions on wound care for your c-section site.   You have a follow-up appointment with Dr. Jolayne Panther on 04/23/15. However, if you have increased vaginal bleeding, develop a fever, or increased abdominal pain, please call your doctor before this visit.  For c-section wound care: In the shower, let soapy water drip on the wound (dont scrub). Pat it dry. If your skin folds over the incision, put a cloth pad on it to keep it from getting sweaty. Within a week, your wound will be mostly healed. But look out for issues: If you develop a fever or if the skin surrounding the incision turns red, it starts oozing green or pus-colored liquid, or it becomes hard or painful, call your doctor. These could be signs of infection.  Postpartum Care After Cesarean Delivery After you deliver your newborn (postpartum period), the usual stay in the hospital is 24-72 hours. If there were problems with your labor or delivery, or if you have other medical problems, you might be in the hospital longer.  While you are in the hospital, you will receive help and instructions on how to care for yourself and your newborn during the postpartum period.  While you are in the hospital:  It is normal for you to have pain or discomfort from the incision in your abdomen. Be sure to tell your nurses when you are having pain, where the pain is located, and what makes the pain worse.  If you are breastfeeding, you may feel uncomfortable contractions of your uterus for a couple of weeks. This is normal. The contractions help your uterus get back to normal size.  It is normal to have some bleeding after delivery.  For the first 1-3 days after delivery, the flow is red and the amount may be similar to a period.  It is common for the flow to start and stop.  In the first few days, you may pass some small clots. Let your  nurses know if you begin to pass large clots or your flow increases.  Do not  flush blood clots down the toilet before having the nurse look at them.  During the next 3-10 days after delivery, your flow should become more watery and pink or brown-tinged in color.  Ten to fourteen days after delivery, your flow should be a small amount of yellowish-white discharge.  The amount of your flow will decrease over the first few weeks after delivery. Your flow may stop in 6-8 weeks. Most women have had their flow stop by 12 weeks after delivery.  You should change your sanitary pads frequently.  Wash your hands thoroughly with soap and water for at least 20 seconds after changing pads, using the toilet, or before holding or feeding your newborn.  Your intravenous (IV) tubing will be removed when you are drinking enough fluids.  The urine drainage tube (urinary catheter) that was inserted before delivery may be removed within 6-8 hours after delivery or when feeling returns to your legs. You should feel like you need to empty your bladder within the first 6-8 hours after the catheter has been removed.  In case you become weak, lightheaded, or faint, call your nurse before you get out of bed for the first time and before you take a shower for the first time.  Within the first few days after delivery, your breasts may begin to feel tender and full.  This is called engorgement. Breast tenderness usually goes away within 48-72 hours after engorgement occurs. You may also notice milk leaking from your breasts. If you are not breastfeeding, do not stimulate your breasts. Breast stimulation can make your breasts produce more milk.  Spending as much time as possible with your newborn is very important. During this time, you and your newborn can feel close and get to know each other. Having your newborn stay in your room (rooming in) will help to strengthen the bond with your newborn. It will give you time to get  to know your newborn and become comfortable caring for your newborn.  Your hormones change after delivery. Sometimes the hormone changes can temporarily cause you to feel sad or tearful. These feelings should not last more than a few days. If these feelings last longer than that, you should talk to your caregiver.  If desired, talk to your caregiver about methods of family planning or contraception.  Talk to your caregiver about immunizations. Your caregiver may want you to have the following immunizations before leaving the hospital:  Tetanus, diphtheria, and pertussis (Tdap) or tetanus and diphtheria (Td) immunization. It is very important that you and your family (including grandparents) or others caring for your newborn are up-to-date with the Tdap or Td immunizations. The Tdap or Td immunization can help protect your newborn from getting ill.  Rubella immunization.  Varicella (chickenpox) immunization.  Influenza immunization. You should receive this annual immunization if you did not receive the immunization during your pregnancy.   This information is not intended to replace advice given to you by your health care provider. Make sure you discuss any questions you have with your health care provider.   Document Released: 02/22/2012 Document Reviewed: 02/22/2012 Elsevier Interactive Patient Education 2016 [employer].  Cuidados en el postparto luego de un parto por cesrea  (Postpartum Care After Cesarean Delivery) Despus del parto (perodo de postparto), la estada normal en el hospital es de 24-72 horas. Si hubo problemas con el trabajo de parto o el parto, o si tiene otros problemas mdicos, es posible que Hydrologist en el hospital por ms [place].  Mientras est en el hospital, recibir [place] e instrucciones sobre cmo cuidar de usted misma y de su beb recin nacido durante el postparto.  Mientras est en el hospital:   Es normal que sienta dolor o molestias en la incisin  en el abdomen. Asegrese de decirle a las enfermeras si Electronics engineer, as como donde Medical laboratory scientific officer y Training and development officer.  Si est amamantando, puede sentir contracciones dolorosas en el tero durante algunas semanas. Esto es normal. Las contracciones ayudan a que el tero vuelva a su tamao normal.  Es normal tener algo de sangrado despus del [place].  Durante los primeros 1-3 das despus del parto, el flujo es de color rojo y la cantidad puede ser similar a un perodo.  Es frecuente que el flujo se inicie y se Chief Strategy Officer.  En los primeros [place], puede eliminar algunos cogulos pequeos. Informe a las enfermeras si comienza a eliminar cogulos grandes o aumenta el flujo.  No  elimine los cogulos de sangre por el inodoro antes de que la [employer] vea.  Durante los prximos 3 a [address] despus del parto, el flujo debe ser ms acuoso y rosado o Child psychotherapist.  [place] a catorce [employer] del parto, el flujo debe ser una pequea cantidad de secrecin de color blanco amarillento.  La cantidad de flujo  disminuir en las primeras semanas despus del parto. El flujo puede detenerse en 6-8 semanas. La mayora de las mujeres no tienen ms flujo a las 12 semanas despus del [place].  Usted debe cambiar sus apsitos con frecuencia.  Lvese bien las manos con agua y jabn durante al menos 20 segundos despus de cambiar el apsito, usar el bao o antes de Occupational psychologist o Corporate treasurer a su recin nacido.  Se le quitar la va intravenosa (IV) cuando ya est bebiendo suficientes lquidos.  El tubo de drenaje para la orina (catter urinario) que se inserta antes del parto puede ser retirado [employer] de 6-8 horas despus del parto o cuando las piernas vuelvan a Warehouse manager sensibilidad. Usted puede sentir que tiene que vaciar la vejiga durante las primeras 6-8 horas despus de que le quiten el Licensed conveyancer.  Si se siente dbil, mareada o se desmaya, llame a su enfermera antes de levantarse de la cama por primera vez y antes de  tomar una ducha por primera vez.  En los primeros [address] despus del parto, podr sentir las mamas sensibles y [place]. Esto se llama congestin. La sensibilidad en los senos por lo general desaparece dentro de las 48-72 horas despus de que ocurre la congestin. Tambin puede notar que la [place] se escapa de sus senos. Si no est amamantando no estimule sus pechos. La estimulacin de las mamas hace que sus senos produzcan ms [place].  Pasar tanto tiempo como sea posible con el beb recin nacido es muy importante. Durante este [place], usted y su beb deben sentirse cerca y conocerse uno al otro. Tener al beb en su habitacin (alojamiento conjunto) ayudar a fortalecer el vnculo con el beb recin nacido. Esto le dar tiempo para conocerlo y atenderlo de [place] cmoda.  Las hormonas se modifican despus del parto. A veces, los cambios hormonales pueden causar tristeza o ganas de llorar por un tiempo. Estos sentimientos no deben durar ms de [employer]. Si duran ms que eso, debe hablar con su mdico.  Si lo desea, hable con su mdico acerca de los mtodos de planificacin familiar o mtodos anticonceptivos.  Hable con su mdico acerca de las vacunas. El mdico puede indicarle que se aplique las siguientes vacunas antes de salir del hospital:  [place] contra el ttanos, la difteria y la tos ferina (Tdap) o el ttanos y la difteria (Td). Es muy importante que usted y su familia (incluyendo a los abuelos) u otras personas que cuidan al recin nacido estn al da con las vacunas Tdap o Td. Las vacunas Tdap o Td pueden ayudar a proteger al recin nacido de enfermedades.  Inmunizacin contra la rubola.  Inmunizacin contra la varicela.  Inmunizacin contra la gripe. Usted debe recibir esta vacunacin anual si no la ha recibido Academic librarian.   Esta informacin no tiene Theme park manager el consejo del mdico. Asegrese de hacerle al mdico cualquier pregunta que tenga.   Document Released:  05/16/2012 Elsevier Interactive Patient Education [employer]

## 2015-03-21 NOTE — Lactation Note (Signed)
This note was copied from the chart of Melanie Kyera Felan. Lactation Consultation Note  Parents visiting infant in NICU. Discussed milk transport and pumping on the unit when here. Mother states she understands about engorgement care. Encouraged parents to call if they have questions.  Patient Name: Melanie Moore MVHQI'O Date: 03/21/2015 Reason for consult: Follow-up assessment   Maternal Data    Feeding    LATCH Score/Interventions                      Lactation Tools Discussed/Used     Consult Status Consult Status: Complete    Hardie Pulley 03/21/2015, 12:53 PM

## 2015-03-21 NOTE — Progress Notes (Signed)

## 2015-03-21 NOTE — Discharge Summary (Signed)
OB Discharge Summary  Patient Name: Melanie Moore DOB: Dec 16, 1990 MRN: [id number]  Date of admission: 03/18/2015 Delivering MD: Duane Lope H   Date of discharge: 03/21/2015  Admitting diagnosis: 38WKS,LABOR,BLEEDING Intrauterine pregnancy: [redacted]w[redacted]d     Secondary diagnosis: None     Discharge diagnosis: pLTCS                                                                                                Post partum procedures:None  Augmentation: AROM  Complications: Cord prolapse and decels s/p version in labor with compound presentation, requiring stat C-section  Active Problems:   Active labor at term   Active labor   S/P cesarean section  Hospital course:  Onset of Labor With Unplanned C/S  24 y.o. yo G2P1101 at [redacted]w[redacted]d was admitted in Active Labor on 03/18/2015. Patient had a labor course significant for breech presentation with successful version resulting in compound presentation. Membrane Rupture Time/Date: 2:31 PM ,03/18/2015   The patient went for cesarean section due to Non-Reassuring FHR and Cord Prolapse, and delivered a Viable infant,03/18/2015  Details of operation can be found in separate operative note. Patient had an uncomplicated postpartum course.  She is ambulating,tolerating a regular diet, passing flatus, has had BMs and is urinating well.  Patient is discharged home in stable condition 03/21/2015 12:45 PM.   Physical exam  Filed Vitals:   03/19/15 2155 03/20/15 1700 03/20/15 2314 03/21/15 0640  BP:  106/62 111/62 123/75  Pulse: 65 73 76 78  Temp: 98.4 F (36.9 C) 98.7 F (37.1 C) 98 F (36.7 C) 98.8 F (37.1 C)  TempSrc: Oral  Oral Oral  Resp: Height:      Weight:      SpO2: 100% 100% 100% 100%   General: alert and cooperative Lochia: appropriate Uterine Fundus: firm Incision: Healing well with no significant drainage DVT Evaluation: No evidence of DVT seen on physical exam. Labs: Lab Results  Component Value Date   WBC 20.4*  03/19/2015   HGB 9.1* 03/19/2015   HCT 27.6* 03/19/2015   MCV 86.0 03/19/2015   PLT 144* 03/19/2015   No flowsheet data found.  Discharge instruction: per After Visit Summary and "Baby and Me Booklet".  Medications:  Current facility-administered medications:  .  acetaminophen (TYLENOL) tablet 650 mg, 650 mg, Oral, Q4H PRN, Lazaro Arms, MD .  witch hazel-glycerin (TUCKS) pad 1 application, 1 application, Topical, PRN **AND** dibucaine (NUPERCAINAL) 1 % rectal ointment 1 application, 1 application, Rectal, PRN, Lazaro Arms, MD .  diphenhydrAMINE (BENADRYL) injection 12.5 mg, 12.5 mg, Intravenous, Q4H PRN **OR** diphenhydrAMINE (BENADRYL) capsule 25 mg, 25 mg, Oral, Q4H PRN, Leilani Able, MD .  diphenhydrAMINE (BENADRYL) capsule 25 mg, 25 mg, Oral, Q6H PRN, Lazaro Arms, MD .  ibuprofen (ADVIL,MOTRIN) tablet 600 mg, 600 mg, Oral, Q6H PRN, Leilani Able, MD, 600 mg at 03/19/15 1803 .  ibuprofen (ADVIL,MOTRIN) tablet 600 mg, 600 mg, Oral, 4 times per day, Lazaro Arms, MD, 600 mg at 03/21/15 [phone] .  lactated ringers infusion, , Intravenous, Continuous, Lazaro Arms,  MD, Last Rate: 125 mL/hr at 03/19/15 0343 .  lanolin ointment 1 application, 1 application, Topical, PRN, Lazaro Arms, MD .  menthol-cetylpyridinium (CEPACOL) lozenge 3 mg, 1 lozenge, Oral, Q2H PRN, Lazaro Arms, MD .  methylergonovine (METHERGINE) tablet 0.2 mg, 0.2 mg, Oral, Q4H PRN **OR** methylergonovine (METHERGINE) injection 0.2 mg, 0.2 mg, Intramuscular, Q4H PRN, Lazaro Arms, MD .  nalbuphine (NUBAIN) injection 5 mg, 5 mg, Intravenous, Q4H PRN **OR** nalbuphine (NUBAIN) injection 5 mg, 5 mg, Subcutaneous, Q4H PRN, Leilani Able, MD .  nalbuphine (NUBAIN) injection 5 mg, 5 mg, Intravenous, Once PRN **OR** nalbuphine (NUBAIN) injection 5 mg, 5 mg, Subcutaneous, Once PRN, Leilani Able, MD .  naloxone (NARCAN) 2 mg in dextrose 5 % 250 mL infusion, 1-4 mcg/kg/hr, Intravenous, Continuous PRN, Leilani Able, MD .  naloxone [facility]) injection 0.4 mg, 0.4 mg, Intravenous, PRN **AND** sodium chloride 0.9 % injection 3 mL, 3 mL, Intravenous, PRN, Leilani Able, MD .  ondansetron (ZOFRAN) injection 4 mg, 4 mg, Intravenous, Q8H PRN, Leilani Able, MD .  oxyCODONE-acetaminophen (PERCOCET/ROXICET) 5-325 MG per tablet 1 tablet, 1 tablet, Oral, Q4H PRN, Lazaro Arms, MD, 1 tablet at 03/21/15 [phone] .  oxyCODONE-acetaminophen (PERCOCET/ROXICET) 5-325 MG per tablet 2 tablet, 2 tablet, Oral, Q4H PRN, Lazaro Arms, MD .  prenatal multivitamin tablet 1 tablet, 1 tablet, Oral, Q1200, Lazaro Arms, MD, 1 tablet at 03/20/15 1307 .  scopolamine (TRANSDERM-SCOP) 1 MG/3DAYS 1.5 mg, 1 patch, Transdermal, Once, Leilani Able, MD .  senna-docusate (Senokot-S) tablet 2 tablet, 2 tablet, Oral, Q24H, Lazaro Arms, MD, 2 tablet at 03/20/15 2314 .  simethicone (MYLICON) chewable tablet 80 mg, 80 mg, Oral, TID PC, Lazaro Arms, MD, 80 mg at 03/20/15 1728 .  simethicone (MYLICON) chewable tablet 80 mg, 80 mg, Oral, Q24H, Lazaro Arms, MD, 80 mg at 03/20/15 2314 .  simethicone (MYLICON) chewable tablet 80 mg, 80 mg, Oral, PRN, Lazaro Arms, MD .  Tdap (BOOSTRIX) injection 0.5 mL, 0.5 mL, Intramuscular, Once, Lazaro Arms, MD, 0.5 mL at 03/19/15 1856 .  zolpidem (AMBIEN) tablet 5 mg, 5 mg, Oral, QHS PRN, Lazaro Arms, MD  Current outpatient prescriptions:  .  Prenatal Vit-Fe Fumarate-FA (PRENATAL MULTIVITAMIN) TABS, Take 1 tablet by mouth at bedtime., Disp: , Rfl:  .  ibuprofen (ADVIL,MOTRIN) 600 MG tablet, Take 1 tablet (600 mg total) by mouth every 6 (six) hours as needed for mild pain., Disp: 30 tablet, Rfl: 0 .  oxyCODONE-acetaminophen (PERCOCET/ROXICET) 5-325 MG tablet, Take 1 tablet by mouth every 4 (four) hours as needed (for pain scale 4-7)., Disp: 30 tablet, Rfl: 0  Diet: routine diet  Activity: Advance as tolerated. Pelvic rest for 6 weeks.   Outpatient follow up: Future Appointments Date  Time Provider Department Center  04/23/2015 12:45 PM Catalina Antigua, MD [facility]    Postpartum contraception: IUD Mirena  Newborn Data: Live born female  Birth Weight: 7 lb 10.1 oz (3460 g) APGAR: 2, 5  Baby Feeding: Breast Disposition:NICU   03/21/2015 Jamelle Haring, MD

## 2015-04-08 NOTE — Discharge Summary (Signed)
OB Discharge Summary  Patient Name: Melanie LloydMilka Jorstad DOB: [birth year] MRN: [id number]  Date of admission: 03/18/2015 Delivering MD: Duane LopeEURE, LUTHER H   Date of discharge: 04/08/2015  Admitting diagnosis: 38WKS,LABOR,BLEEDING Intrauterine pregnancy: 3152w4d     Secondary diagnosis: None     Discharge diagnosis: pLTCS                                                                                                Post partum procedures:None  Augmentation: AROM  Complications: Cord prolapse and decels s/p version in labor with compound presentation, requiring stat C-section  Active Problems:   Active labor at term   Active labor   S/P cesarean section  Hospital course:  Onset of Labor With Unplanned C/S  24 y.o. yo G2P1101 at 9852w4d was admitted in Active Labor on 03/18/2015. Patient had a labor course significant for breech presentation with successful version resulting in compound presentation. Membrane Rupture Time/Date: 2:31 PM ,03/18/2015   The patient went for cesarean section due to Non-Reassuring FHR and Cord Prolapse, and delivered a Viable infant,03/18/2015  Details of operation can be found in separate operative note. Patient had an uncomplicated postpartum course.  She is ambulating,tolerating a regular diet, passing flatus, has had BMs and is urinating well.  Patient is discharged home in stable condition 03/21/2015 12:45 PM.   Physical exam  Filed Vitals:   03/19/15 2155 03/20/15 1700 03/20/15 2314 03/21/15 0640  BP:  106/62 111/62 123/75  Pulse: 65 73 76 78  Temp: 98.4 F (36.9 C) 98.7 F (37.1 C) 98 F (36.7 C) 98.8 F (37.1 C)  TempSrc: Oral  Oral Oral  Resp: 18 18 16 18   Height:      Weight:      SpO2: 100% 100% 100% 100%   General: alert and cooperative Lochia: appropriate Uterine Fundus: firm Incision: Healing well with no significant drainage DVT Evaluation: No evidence of DVT seen on physical exam. Labs: Lab Results  Component Value Date   WBC 20.4*  03/19/2015   HGB 9.1* 03/19/2015   HCT 27.6* 03/19/2015   MCV 86.0 03/19/2015   PLT 144* 03/19/2015   No flowsheet data found.  Discharge instruction: per After Visit Summary and "Baby and Me Booklet".  Medications: No current facility-administered medications for this encounter.  Current outpatient prescriptions:  .  Prenatal Vit-Fe Fumarate-FA (PRENATAL MULTIVITAMIN) TABS, Take 1 tablet by mouth at bedtime., Disp: , Rfl:  .  ibuprofen (ADVIL,MOTRIN) 600 MG tablet, Take 1 tablet (600 mg total) by mouth every 6 (six) hours as needed for mild pain., Disp: 30 tablet, Rfl: 0 .  oxyCODONE-acetaminophen (PERCOCET/ROXICET) 5-325 MG tablet, Take 1 tablet by mouth every 4 (four) hours as needed (for pain scale 4-7)., Disp: 30 tablet, Rfl: 0  Diet: routine diet  Activity: Advance as tolerated. Pelvic rest for 6 weeks.   Outpatient follow up: Future Appointments Date Time Provider Department Center  04/23/2015 12:45 PM Catalina AntiguaPeggy Constant, MD [facility]    Postpartum contraception: IUD Mirena  Newborn Data: Live born female  Birth Weight: 7 lb 10.1 oz (3460 g) APGAR: 2,  5  Baby Feeding: Breast Disposition:NICU   04/08/2015 Leeroy Cha, CNM

## 2015-04-20 ENCOUNTER — Encounter: Payer: Self-pay | Admitting: Obstetrics & Gynecology

## 2015-04-20 ENCOUNTER — Ambulatory Visit (INDEPENDENT_AMBULATORY_CARE_PROVIDER_SITE_OTHER): Payer: Medicaid Other | Admitting: Obstetrics & Gynecology

## 2015-04-20 VITALS — BP 128/76 | HR 63 | Temp 98.8°F | Ht 66.0 in | Wt 155.8 lb

## 2015-04-20 DIAGNOSIS — Z3202 Encounter for pregnancy test, result negative: Secondary | ICD-10-CM | POA: Diagnosis present

## 2015-04-20 DIAGNOSIS — Z98891 History of uterine scar from previous surgery: Secondary | ICD-10-CM

## 2015-04-20 LAB — POCT PREGNANCY, URINE: PREG TEST UR: NEGATIVE

## 2015-04-20 MED ORDER — NORELGESTROMIN-ETH ESTRADIOL 150-35 MCG/24HR TD PTWK: 1.0000 | MEDICATED_PATCH | TRANSDERMAL | Status: DC

## 2015-04-20 NOTE — Progress Notes (Signed)
Patient ID: Melanie LloydMilka Moore, female   DOB: 1991/06/08, 24 y.o.   MRN: [id number] Subjective:     Melanie Moore is a 24 y.o. female who presents for a postpartum visit. She is 5 weeks postpartum following a low cervical transverse Cesarean section. I have fully reviewed the prenatal and intrapartum course. The delivery was at 38.4 gestational weeks. Outcome: primary cesarean section, low vertical incision. Anesthesia: spinal. Postpartum course has been good. Baby's course has been good. Baby is feeding by breast. Bleeding no bleeding. Bowel function is normal. Bladder function is normal. Patient is not sexually active. Contraception method is Ship brokerrtho-Evra patches weekly. Postpartum depression screening: negative.  The following portions of the patient's history were reviewed and updated as appropriate: allergies, current medications, past family history, past medical history, past social history, past surgical history and problem list.  Review of Systems Pertinent items are noted in HPI.   Objective:    BP 128/76 mmHg  Pulse 63  Temp(Src) 98.8 F (37.1 C) (Oral)  Ht 5\' 6"  (1.676 m)  Wt 155 lb 12.8 oz (70.67 kg)  BMI 25.16 kg/m2  Breastfeeding? Yes  General:  alert, cooperative and no distress           Abdomen: soft, non-tender; bowel sounds normal; no masses,  no organomegaly and incision well-healed   Vulva:  not evaluated  Vagina: not evaluated                    Assessment:     normal postpartum exam. Pap smear not done at today's visit.   Plan:    1. Contraception: Ortho-Evra patches weekly 2. She will use condoms and spermacide until weaning 3. Follow up as needed.    Adam PhenixJames G Wandy Bossler, MD 04/20/2015

## 2015-04-23 ENCOUNTER — Ambulatory Visit: Payer: Self-pay | Admitting: Obstetrics and Gynecology

## 2016-05-13 ENCOUNTER — Encounter (HOSPITAL_COMMUNITY): Payer: Self-pay

## 2016-05-13 ENCOUNTER — Inpatient Hospital Stay (HOSPITAL_COMMUNITY)
Admission: AD | Admit: 2016-05-13 | Discharge: 2016-05-13 | Disposition: A | Discharge status: Home / Self Care | Payer: Medicaid Other | Source: Ambulatory Visit | Attending: Obstetrics & Gynecology | Admitting: Obstetrics & Gynecology

## 2016-05-13 DIAGNOSIS — Z3A00 Weeks of gestation of pregnancy not specified: Secondary | ICD-10-CM | POA: Insufficient documentation

## 2016-05-13 DIAGNOSIS — O23592 Infection of other part of genital tract in pregnancy, second trimester: Secondary | ICD-10-CM | POA: Diagnosis not present

## 2016-05-13 DIAGNOSIS — O26892 Other specified pregnancy related conditions, second trimester: Secondary | ICD-10-CM | POA: Insufficient documentation

## 2016-05-13 DIAGNOSIS — Z3A23 23 weeks gestation of pregnancy: Secondary | ICD-10-CM

## 2016-05-13 DIAGNOSIS — N760 Acute vaginitis: Secondary | ICD-10-CM | POA: Diagnosis not present

## 2016-05-13 DIAGNOSIS — O468X2 Other antepartum hemorrhage, second trimester: Secondary | ICD-10-CM | POA: Diagnosis not present

## 2016-05-13 DIAGNOSIS — B9689 Other specified bacterial agents as the cause of diseases classified elsewhere: Secondary | ICD-10-CM

## 2016-05-13 DIAGNOSIS — Z3492 Encounter for supervision of normal pregnancy, unspecified, second trimester: Secondary | ICD-10-CM

## 2016-05-13 LAB — URINALYSIS, ROUTINE W REFLEX MICROSCOPIC
Bilirubin Urine: NEGATIVE
Glucose, UA: NEGATIVE mg/dL
Hgb urine dipstick: NEGATIVE
Ketones, ur: NEGATIVE mg/dL
NITRITE: NEGATIVE
PH: 7 (ref 5.0–8.0)
Protein, ur: NEGATIVE mg/dL
SPECIFIC GRAVITY, URINE: 1.02 (ref 1.005–1.030)

## 2016-05-13 LAB — POCT PREGNANCY, URINE: PREG TEST UR: POSITIVE — AB

## 2016-05-13 LAB — URINE MICROSCOPIC-ADD ON: RBC / HPF: NONE SEEN RBC/hpf (ref 0–5)

## 2016-05-13 NOTE — MAU Note (Signed)
Patient has been on birth control patches for about a year, about a month ago started feeling movement, 2 positive pregnancy tests, having spotting x 8 months.

## 2016-05-13 NOTE — MAU Provider Note (Signed)
History     CSN: [id number]  Arrival date and time: 05/13/16 1635   First Provider Initiated Contact with Patient 05/13/16 1741      No chief complaint on file.  Patient is a 25 year old G3 P2 at approximately [redacted] weeks gestation. She presents today to determine if she is pregnant. She reports she's been wearing her birth control patch as usual as she has been for about 1 year. She doesn't recall any time where she didn't wear it or it fell off for an extended period of time. She reports she does have spotting intermittently while wearing the past for the past year but denies any current vaginal bleeding. She does report fetal movement which is what brought her in. Additionally she reports some intermittent breast pain. She denies any contractions or leaking of fluid. She reports [facility] 2 previous [facility]    OB History    Gravida Para Term Preterm AB Living   3 2 1 1   1    SAB TAB Ectopic Multiple Live Births         0 2      Past Medical History:  Diagnosis Date  . 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 Medical history non-contributory     Past Surgical History:  Procedure Laterality Date  . CESAREAN SECTION N/A 03/18/2015   Procedure: CESAREAN SECTION;  Surgeon: Catalina AntiguaPeggy Constant, MD;  Location: [facility];  Service: Obstetrics;  Laterality: N/A;  . EYE SURGERY  2008   sty removed both eyes    Family History  Problem Relation Age of Onset  . Diabetes Father   . Hyperlipidemia Father   . Diabetes Maternal Grandfather   . Diabetes Paternal Grandmother   . Diabetes Paternal Grandfather   . Depression Mother   . Fibromyalgia Mother     Social History  Substance Use Topics  . Smoking status: Never Smoker  . Smokeless tobacco: Never Used  . Alcohol use No     Comment: socially before pregnancy    Allergies: No Known  Allergies  Prescriptions Prior to Admission  Medication Sig Dispense Refill Last Dose  . ibuprofen (ADVIL,MOTRIN) 600 MG tablet Take 1 tablet (600 mg total) by mouth every 6 (six) hours as needed for mild pain. (Patient not taking: Reported on 04/20/2015) 30 tablet 0 Not Taking  . norelgestromin-ethinyl estradiol (ORTHO EVRA) 150-35 MCG/24HR transdermal patch Place 1 patch onto the skin once a week. 3 patch 12   . oxyCODONE-acetaminophen (PERCOCET/ROXICET) 5-325 MG tablet Take 1 tablet by mouth every 4 (four) hours as needed (for pain scale 4-7). (Patient not taking: Reported on 04/20/2015) 30 tablet 0 Not Taking  . Prenatal Vit-Fe Fumarate-FA (PRENATAL MULTIVITAMIN) TABS Take 1 tablet by mouth at bedtime.   Taking    Review of Systems  Constitutional: Negative for chills and fever.  Cardiovascular: Negative for chest pain and palpitations.  Gastrointestinal: Negative for abdominal pain, constipation, diarrhea, heartburn, nausea and vomiting.  Genitourinary: Negative for dysuria, frequency and urgency.  Musculoskeletal: Negative for myalgias.  Neurological: Negative for dizziness and headaches.  Endo/Heme/Allergies: Negative for environmental allergies. Does not bruise/bleed easily.   Physical Exam   Blood pressure 133/69, pulse 88, temperature 98.4 F (36.9 C), resp. rate 16, height 5\' 7"  (1.702 m), weight 164 lb (74.4 kg), currently breastfeeding.  Physical Exam  Constitutional: She  is oriented to person, place, and time. She appears well-developed and well-nourished.  HENT:  Head: Normocephalic and atraumatic.  Cardiovascular: Normal rate and intact distal pulses.   Respiratory: Effort normal. No respiratory distress.  GI: Soft. She exhibits no distension. There is no tenderness. There is no rebound.  Fundal height approximately umbilicus 18 cm  Musculoskeletal: Normal range of motion. She exhibits no edema.  Neurological: She is alert and oriented to person, place, and time.   Skin: Skin is warm and dry.  Psychiatric: She has a normal mood and affect. Her behavior is normal.    MAU Course  Procedures  MDM In MA U fetal heart tones were obtained. Fundal height was obtained. As patient had no complaints we discussed that she was pregnant and that we would recommend ultrasound and prenatal labs as soon as possible. Order was placed for outpatient anatomy scan as well as dating. [facility]  Assessment and Plan  #1: Pregnancy and likely the second trimester, unsure dates would recommend anatomy scan which was ordered as an outpatient as well as to establish a OB. Patient voiced understanding and had no further questions.  Ernestina Pennaicholas Schenk 05/13/2016, 5:50 PM

## 2016-05-13 NOTE — Progress Notes (Signed)
Signature pad not working in triage

## 2016-05-13 NOTE — Discharge Instructions (Signed)
Second Trimester of Pregnancy The second trimester is from week 13 through week 28 (months 4 through 6). The second trimester is often a time when you feel your best. Your body has also adjusted to being pregnant, and you begin to feel better physically. Usually, morning sickness has lessened or quit completely, you may have more energy, and you may have an increase in appetite. The second trimester is also a time when the fetus is growing rapidly. At the end of the sixth month, the fetus is about 9 inches long and weighs about 1 pounds. You will likely begin to feel the baby move (quickening) between 18 and 20 weeks of the pregnancy. Body changes during your second trimester Your body continues to go through many changes during your second trimester. The changes vary from woman to woman.  Your weight will continue to increase. You will notice your lower abdomen bulging out.  You may begin to get stretch marks on your hips, abdomen, and breasts.  You may develop headaches that can be relieved by medicines. The medicines should be approved by your health care provider.  You may urinate more often because the fetus is pressing on your bladder.  You may develop or continue to have heartburn as a result of your pregnancy.  You may develop constipation because certain hormones are causing the muscles that push waste through your intestines to slow down.  You may develop hemorrhoids or swollen, bulging veins (varicose veins).  You may have back pain. This is caused by:  Weight gain.  Pregnancy hormones that are relaxing the joints in your pelvis.  A shift in weight and the muscles that support your balance.  Your breasts will continue to grow and they will continue to become tender.  Your gums may bleed and may be sensitive to brushing and flossing.  Dark spots or blotches (chloasma, mask of pregnancy) may develop on your face. This will likely fade after the baby is born.  A dark line  from your belly button to the pubic area (linea nigra) may appear. This will likely fade after the baby is born.  You may have changes in your hair. These can include thickening of your hair, rapid growth, and changes in texture. Some women also have hair loss during or after pregnancy, or hair that feels dry or thin. Your hair will most likely return to normal after your baby is born. What to expect at prenatal visits During a routine prenatal visit:  You will be weighed to make sure you and the fetus are growing normally.  Your blood pressure will be taken.  Your abdomen will be measured to track your baby's growth.  The fetal heartbeat will be listened to.  Any test results from the previous visit will be discussed. Your health care provider may ask you:  How you are feeling.  If you are feeling the baby move.  If you have had any abnormal symptoms, such as leaking fluid, bleeding, severe headaches, or abdominal cramping.  If you are using any tobacco products, including cigarettes, chewing tobacco, and electronic cigarettes.  If you have any questions. Other tests that may be performed during your second trimester include:  Blood tests that check for:  Low iron levels (anemia).  Gestational diabetes (between 24 and 28 weeks).  Rh antibodies. This is to check for a protein on red blood cells (Rh factor).  Urine tests to check for infections, diabetes, or protein in the urine.  An ultrasound to   confirm the proper growth and development of the baby.  An amniocentesis to check for possible genetic problems.  Fetal screens for spina bifida and Down syndrome.  HIV (human immunodeficiency virus) testing. Routine prenatal testing includes screening for HIV, unless you choose not to have this test. Follow these instructions at home: Eating and drinking  Continue to eat regular, healthy meals.  Avoid raw meat, uncooked cheese, cat litter boxes, and soil used by cats. These  carry germs that can cause birth defects in the baby.  Take your prenatal vitamins.  Take 1500-2000 mg of calcium daily starting at the 20th week of pregnancy until you deliver your baby.  If you develop constipation:  Take over-the-counter or prescription medicines.  Drink enough fluid to keep your urine clear or pale yellow.  Eat foods that are high in fiber, such as fresh fruits and vegetables, whole grains, and beans.  Limit foods that are high in fat and processed sugars, such as fried and sweet foods. Activity  Exercise only as directed by your health care provider. Experiencing uterine cramps is a good sign to stop exercising.  Avoid heavy lifting, wear low heel shoes, and practice good posture.  Wear your seat belt at all times when driving.  Rest with your legs elevated if you have leg cramps or low back pain.  Wear a good support bra for breast tenderness.  Do not use hot tubs, steam rooms, or saunas. Lifestyle  Avoid all smoking, herbs, alcohol, and unprescribed drugs. These chemicals affect the formation and growth of the baby.  Do not use any products that contain nicotine or tobacco, such as cigarettes and e-cigarettes. If you need help quitting, ask your health care provider.  A sexual relationship may be continued unless your health care provider directs you otherwise. General instructions  Follow your health care provider's instructions regarding medicine use. There are medicines that are either safe or unsafe to take during pregnancy.  Take warm sitz baths to soothe any pain or discomfort caused by hemorrhoids. Use hemorrhoid cream if your health care provider approves.  If you develop varicose veins, wear support hose. Elevate your feet for 15 minutes, 3-4 times a day. Limit salt in your diet.  Visit your dentist if you have not gone yet during your pregnancy. Use a soft toothbrush to brush your teeth and be gentle when you floss.  Keep all follow-up  prenatal visits as told by your health care provider. This is important. Contact a health care provider if:  You have dizziness.  You have mild pelvic cramps, pelvic pressure, or nagging pain in the abdominal area.  You have persistent nausea, vomiting, or diarrhea.  You have a bad smelling vaginal discharge.  You have pain with urination. Get help right away if:  You have a fever.  You are leaking fluid from your vagina.  You have spotting or bleeding from your vagina.  You have severe abdominal cramping or pain.  You have rapid weight gain or weight loss.  You have shortness of breath with chest pain.  You notice sudden or extreme swelling of your face, hands, ankles, feet, or legs.  You have not felt your baby move in over an hour.  You have severe headaches that do not go away with medicine.  You have vision changes. Summary  The second trimester is from week 13 through week 28 (months 4 through 6). It is also a time when the fetus is growing rapidly.  Your body goes   through many changes during pregnancy. The changes vary from woman to woman.  Avoid all smoking, herbs, alcohol, and unprescribed drugs. These chemicals affect the formation and growth your baby.  Do not use any tobacco products, such as cigarettes, chewing tobacco, and e-cigarettes. If you need help quitting, ask your health care provider.  Contact your health care provider if you have any questions. Keep all prenatal visits as told by your health care provider. This is important. This information is not intended to replace advice given to you by your health care provider. Make sure you discuss any questions you have with your health care provider. Document Released: 05/24/2001 Document Revised: 11/05/2015 Document Reviewed: 07/31/2012 Elsevier Interactive Patient Education  2017 [employer]  

## 2016-05-20 ENCOUNTER — Other Ambulatory Visit (HOSPITAL_COMMUNITY): Payer: Self-pay | Admitting: Obstetrics and Gynecology

## 2016-05-20 ENCOUNTER — Ambulatory Visit (HOSPITAL_COMMUNITY)
Admission: RE | Admit: 2016-05-20 | Discharge: 2016-05-20 | Disposition: A | Discharge status: Home / Self Care | Payer: Medicaid Other | Source: Ambulatory Visit | Attending: Obstetrics and Gynecology | Admitting: Obstetrics and Gynecology

## 2016-05-20 ENCOUNTER — Encounter (HOSPITAL_COMMUNITY): Payer: Self-pay

## 2016-05-20 DIAGNOSIS — Z98891 History of uterine scar from previous surgery: Secondary | ICD-10-CM

## 2016-05-20 DIAGNOSIS — O4442 Low lying placenta NOS or without hemorrhage, second trimester: Secondary | ICD-10-CM

## 2016-05-20 DIAGNOSIS — Z3A22 22 weeks gestation of pregnancy: Secondary | ICD-10-CM | POA: Insufficient documentation

## 2016-05-20 DIAGNOSIS — O0932 Supervision of pregnancy with insufficient antenatal care, second trimester: Secondary | ICD-10-CM

## 2016-05-20 DIAGNOSIS — Z3687 Encounter for antenatal screening for uncertain dates: Secondary | ICD-10-CM | POA: Insufficient documentation

## 2016-05-20 DIAGNOSIS — Z3689 Encounter for other specified antenatal screening: Secondary | ICD-10-CM

## 2016-05-20 DIAGNOSIS — Z3492 Encounter for supervision of normal pregnancy, unspecified, second trimester: Secondary | ICD-10-CM

## 2016-05-24 DIAGNOSIS — O4442 Low lying placenta NOS or without hemorrhage, second trimester: Secondary | ICD-10-CM | POA: Insufficient documentation

## 2016-05-26 ENCOUNTER — Ambulatory Visit: Payer: 59

## 2016-05-26 VITALS — BP 107/65

## 2016-05-26 DIAGNOSIS — Z0130 Encounter for examination of blood pressure without abnormal findings: Secondary | ICD-10-CM

## 2016-05-26 NOTE — Progress Notes (Signed)
Pt here today for BP check.  Looking in pt's chart pt did not need BP check but a NEW OB appt scheduled.  Provider notified.  Pt advised to call the office with concerns.  Pt stated understanding with no further questions.

## 2016-05-30 ENCOUNTER — Inpatient Hospital Stay (HOSPITAL_COMMUNITY)
Admission: AD | Admit: 2016-05-30 | Discharge: 2016-05-30 | Disposition: A | Discharge status: Home / Self Care | Payer: Managed Care, Other (non HMO) | Source: Ambulatory Visit | Attending: Family Medicine | Admitting: Family Medicine

## 2016-05-30 ENCOUNTER — Encounter (HOSPITAL_COMMUNITY): Payer: Self-pay | Admitting: *Deleted

## 2016-05-30 ENCOUNTER — Inpatient Hospital Stay (HOSPITAL_COMMUNITY): Payer: Managed Care, Other (non HMO)

## 2016-05-30 DIAGNOSIS — O4442 Low lying placenta NOS or without hemorrhage, second trimester: Secondary | ICD-10-CM | POA: Diagnosis not present

## 2016-05-30 DIAGNOSIS — N760 Acute vaginitis: Secondary | ICD-10-CM | POA: Diagnosis not present

## 2016-05-30 DIAGNOSIS — Z3A23 23 weeks gestation of pregnancy: Secondary | ICD-10-CM | POA: Insufficient documentation

## 2016-05-30 DIAGNOSIS — B9689 Other specified bacterial agents as the cause of diseases classified elsewhere: Secondary | ICD-10-CM | POA: Insufficient documentation

## 2016-05-30 DIAGNOSIS — O26892 Other specified pregnancy related conditions, second trimester: Secondary | ICD-10-CM | POA: Insufficient documentation

## 2016-05-30 DIAGNOSIS — R109 Unspecified abdominal pain: Secondary | ICD-10-CM | POA: Diagnosis present

## 2016-05-30 DIAGNOSIS — N939 Abnormal uterine and vaginal bleeding, unspecified: Secondary | ICD-10-CM

## 2016-05-30 HISTORY — DX: Unspecified abnormal cytological findings in specimens from vagina: R87.629

## 2016-05-30 LAB — URINALYSIS, ROUTINE W REFLEX MICROSCOPIC
Bilirubin Urine: NEGATIVE
GLUCOSE, UA: 50 mg/dL — AB
KETONES UR: NEGATIVE mg/dL
Nitrite: NEGATIVE
PH: 5 (ref 5.0–8.0)
Protein, ur: NEGATIVE mg/dL
SPECIFIC GRAVITY, URINE: 1.026 (ref 1.005–1.030)

## 2016-05-30 LAB — WET PREP, GENITAL
SPERM: NONE SEEN
TRICH WET PREP: NONE SEEN
Yeast Wet Prep HPF POC: NONE SEEN

## 2016-05-30 MED ORDER — METRONIDAZOLE 500 MG PO TABS: 500.0000 mg | ORAL_TABLET | Freq: Two times a day (BID) | ORAL | 0 refills | Status: AC

## 2016-05-30 NOTE — MAU Note (Signed)
Pt C/O spotting - has been spotting throughout pregnancy. Bleeding is more like a period today.  Also having pelvic pain ever since second pregnancy, but is much more painful now.  Hurts worse with activity.  Hasn't felt FM since last night.

## 2016-05-30 NOTE — Discharge Instructions (Signed)
Bacterial Vaginosis Bacterial vaginosis is an infection of the vagina. It happens when too many germs (bacteria) grow in the vagina. This infection puts you at risk for infections from sex (STIs). Treating this infection can lower your risk for some STIs. You should also treat this if you are pregnant. It can cause your baby to be born early. Follow these instructions at home: Medicines  Take over-the-counter and prescription medicines only as told by your doctor.  Take or use your antibiotic medicine as told by your doctor. Do not stop taking or using it even if you start to feel better. General instructions  If you your sexual partner is a woman, tell her that you have this infection. She needs to get treatment if she has symptoms. If you have a female partner, he does not need to be treated.  During treatment:  Avoid sex.  Do not douche.  Avoid alcohol as told.  Avoid breastfeeding as told.  Drink enough fluid to keep your pee (urine) clear or pale yellow.  Keep your vagina and butt (rectum) clean.  Wash the area with warm water every day.  Wipe from front to back after you use the toilet.  Keep all follow-up visits as told by your doctor. This is important. Preventing this condition  Do not douche.  Use only warm water to wash around your vagina.  Use protection when you have sex. This includes:  Latex condoms.  Dental dams.  Limit how many people you have sex with. It is best to only have sex with the same person (be monogamous).  Get tested for STIs. Have your partner get tested.  Wear underwear that is cotton or lined with cotton.  Avoid tight pants and pantyhose. This is most important in summer.  Do not use any products that have nicotine or tobacco in them. These include cigarettes and e-cigarettes. If you need help quitting, ask your doctor.  Do not use illegal drugs.  Limit how much alcohol you drink. Contact a doctor if:  Your symptoms do not get  better, even after you are treated.  You have more discharge or pain when you pee (urinate).  You have a fever.  You have pain in your belly (abdomen).  You have pain with sex.  Your bleed from your vagina between periods. Summary  This infection happens when too many germs (bacteria) grow in the vagina.  Treating this condition can lower your risk for some infections from sex (STIs).  You should also treat this if you are pregnant. It can cause early (premature) birth.  Do not stop taking or using your antibiotic medicine even if you start to feel better. This information is not intended to replace advice given to you by your health care provider. Make sure you discuss any questions you have with your health care provider. Document Released: 03/08/2008 Document Revised: 02/13/2016 Document Reviewed: 02/13/2016 Elsevier Interactive Patient Education  2017 [employer] Inc[employer] Placenta Previa Placenta previa is a condition in which the placenta implants in the lower part of the uterus in pregnant women. The placenta either partially or completely covers the opening to the cervix. This is a problem because the baby must pass through the cervix during delivery. There are three types of placenta previa:  Marginal placenta previa. The placenta reaches within an inch (2.5 cm) of the cervical opening but does not cover it.  Partial placenta previa. The placenta covers part of the cervical opening.  Complete placenta previa. The placenta covers  the entire cervical opening. If the previa is marginal or partial and it is diagnosed in the first half of pregnancy, the placenta may move into a normal position as the pregnancy progresses and may no longer cover the cervix. It is important to keep all prenatal visits with your health care provider so you can be more closely monitored. What are the causes? The cause of this condition is not known. What increases the risk? This condition is more likely  to develop in women who:  Are carrying more than one baby (multiples).  Have an abnormally shaped uterus.  Have scars on the lining of the uterus.  Have had surgeries involving the uterus, such as a cesarean delivery.  Have delivered a baby before.  Have a history of placenta previa.  Have smoked or used cocaine during pregnancy.  Are age 27 or older during pregnancy. What are the signs or symptoms? The main symptom of this condition is sudden, painless vaginal bleeding during the second half of pregnancy. The amount of bleeding can be very light at first, and it usually stops on its own. Heavier bleeding episodes may also happen. Some women with placenta previa may have no bleeding at all. How is this diagnosed?  This condition is diagnosed:  From an ultrasound. This test uses sound waves to find where the placenta is located before you have any bleeding episodes.  During a checkup after vaginal bleeding is noticed.  If you are diagnosed with a partial or complete previa, digital exams with fingers will generally be avoided. Your health care provider will still perform a speculum exam.  If you did not have an ultrasound during your pregnancy, placenta previa may not be diagnosed until bleeding occurs during labor. How is this treated? Treatment for this condition may include:  Decreased activity.  Bed rest at home or in the hospital.  Pelvic rest. Nothing is placed inside the vagina during pelvic rest. This means not having sex and not using tampons or douches.  A blood transfusion to replace blood that you have lost (maternal blood loss).  A cesarean delivery. This may be performed if:  The bleeding is heavy and cannot be controlled.  The placenta completely covers the cervix.  Medicines to stop premature labor or to help the baby's lungs to mature. This treatment may be used if you need delivery before your pregnancy is full-term. Your treatment will be decided  based on:  How much you are bleeding, or whether the bleeding has stopped.  How far along you are in your pregnancy.  The condition of your baby.  The type of placenta previa that you have. Follow these instructions at home:  Get plenty of rest and lessen activity as told by your health care provider.  Stay on bed rest for as long as told by your health care provider.  Do not have sex, use tampons, use a douche, or place anything inside of your vagina if your health care provider recommended pelvic rest.  Take over-the-counter and prescription medicines as told by your health care provider.  Keep all follow-up visits as told by your health care provider. This is important. Get help right away if:  You have vaginal bleeding, even if in small amounts and even if you have no pain.  You have cramping or regular contractions.  You have pain in your abdomen or your lower back.  You have a feeling of increased pressure in your pelvis.  You have increased watery or  bloody mucus from the vagina. This information is not intended to replace advice given to you by your health care provider. Make sure you discuss any questions you have with your health care provider. Document Released: 05/30/2005 Document Revised: 02/17/2016 Document Reviewed: 12/12/2015 Elsevier Interactive Patient Education  2017 [employer].

## 2016-05-30 NOTE — MAU Provider Note (Signed)
MAU PROVIDER NOTE  Chief Complaint:  Vaginal Bleeding and Pelvic Pain  HPI: Melanie Moore is a 25 y.o. [id number] at 4528w4d who presents to maternity admissions reporting vaginal bleeding. Spotting since last week but last night noticed more like a period-type of bleeding.  Has had some cramping.  Did not have any other episodes of bleeding prior to this in her pregnancy besides spotting.  Abdominal pain on lower left side, feels like pressure.  Pain only occurs when moving positions but is not present at rest.  Denies urinary symptoms. Has not taken any medications.  Denies fevers, chills, nausea, vomiting, diarrhea, headache, swelling in LE.    Denies contractions, leakage of fluid. Good fetal movement.    Pregnancy Course: low lying placenta on ultrasound   Past Medical History: Past Medical History:  Diagnosis Date  . 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 Vaginal Pap smear, abnormal    Past obstetric history: OB History  Gravida Para Term Preterm AB Living  3 2 1 1   2   SAB TAB Ectopic Multiple Live Births        0 2    # Outcome Date GA Lbr Len/2nd Weight Sex Delivery Anes PTL Lv  3 Current           2 Term 03/18/15 977w4d 09:00 / 02:19 7 lb 10.1 oz (3.46 kg) F CS-Vac EPI  LIV  1 Preterm 03/07/13 4051w6d 07:27 / 05:38 6 lb 2.8 oz (2.8 kg) M Vag-Spont EPI  LIV     Birth Comments: WNL     Past Surgical History: Past Surgical History:  Procedure Laterality Date  . CESAREAN SECTION N/A 03/18/2015   Procedure: CESAREAN SECTION;  Surgeon: Catalina AntiguaPeggy Constant, MD;  Location: [facility];  Service: Obstetrics;  Laterality: N/A;  . EYE SURGERY  2008   sty removed both eyes   Family History: Family History  Problem Relation Age of Onset  . Diabetes Father   . Hyperlipidemia Father   . Depression Mother   . Fibromyalgia Mother   . Diabetes Maternal Grandfather   . Diabetes Paternal Grandmother   . Diabetes  Paternal Grandfather    Social History: Social History  Substance Use Topics  . Smoking status: Never Smoker  . Smokeless tobacco: Never Used  . Alcohol use No     Comment: socially before pregnancy   Allergies: No Known Allergies  Meds:  Prescriptions Prior to Admission  Medication Sig Dispense Refill Last Dose  . naproxen sodium (ANAPROX) 220 MG tablet Take 220 mg by mouth as needed (pain).   Past Month at Unknown time  . Prenatal Vit-Fe Fumarate-FA (PRENATAL MULTIVITAMIN) TABS Take 1 tablet by mouth at bedtime.   05/29/2016 at Unknown time   I have reviewed patient's Past Medical Hx, Surgical Hx, Family Hx, Social Hx, medications and allergies.   ROS:  A comprehensive ROS was negative except per HPI.   Physical Exam   Patient Vitals for the past 24 hrs:  BP Temp Temp src Pulse Resp  05/30/16 1055 109/61 98.1 F (36.7 C) Oral 86 18   Constitutional: Well-developed, well-nourished female in no acute distress.  Cardiovascular: normal rate Respiratory: normal effort GI: Abd soft, non-tender, gravid appropriate for gestational age. Pos BS x 4 MS: Extremities nontender, no edema, normal ROM Neurologic: Alert and oriented x 4.  GU: Neg CVAT.  Pelvic: NEFG, physiologic discharge, dried blood in  cervix, No CMT.  Cervix closed.     FHT:  Baseline 140s , moderate variability, accelerations present, no decelerations Contractions: NA   Labs: Results for orders placed or performed during the hospital encounter of 05/30/16 (from the past 24 hour(s))  Urinalysis, Routine w reflex microscopic     Status: Abnormal   Collection Time: 05/30/16 11:13 AM  Result Value Ref Range   Color, Urine YELLOW YELLOW   APPearance HAZY (A) CLEAR   Specific Gravity, Urine 1.026 1.005 - 1.030   pH 5.0 5.0 - 8.0   Glucose, UA 50 (A) NEGATIVE mg/dL   Hgb urine dipstick LARGE (A) NEGATIVE   Bilirubin Urine NEGATIVE NEGATIVE   Ketones, ur NEGATIVE NEGATIVE mg/dL   Protein, ur NEGATIVE NEGATIVE  mg/dL   Nitrite NEGATIVE NEGATIVE   Leukocytes, UA TRACE (A) NEGATIVE   RBC / HPF 0-5 0 - 5 RBC/hpf   WBC, UA 6-30 0 - 5 WBC/hpf   Bacteria, UA RARE (A) NONE SEEN   Squamous Epithelial / LPF 0-5 (A) NONE SEEN   Mucous PRESENT   Wet prep, genital     Status: Abnormal   Collection Time: 05/30/16 12:03 PM  Result Value Ref Range   Yeast Wet Prep HPF POC NONE SEEN NONE SEEN   Trich, Wet Prep NONE SEEN NONE SEEN   Clue Cells Wet Prep HPF POC PRESENT (A) NONE SEEN   WBC, Wet Prep HPF POC MANY (A) NONE SEEN   Sperm NONE SEEN    Imaging:  [place] Mfm Ob Comp + 14 Wk  Result Date: 05/24/2016 OBSTETRICAL ULTRASOUND: This exam was performed within a [facility]. The OB US report was generated in the AS system, and faxed to the ordering physician.  This report is available in the [employer]. See the AS Obstetric US report via the Image Link.  [place] Mfm Ob Limited  Result Date: 05/30/2016 OBSTETRICAL ULTRASOUND: This exam was performed within a [facility]. The OB US report was generated in the AS system, and faxed to the ordering physician.  This report is available in the [employer]. See the AS Obstetric US report via the Image Link.  MAU Course: GC Chlamydia Wet prep Ultrasound   MDM: Plan of care reviewed with patient, including labs and tests ordered and medical treatment.  Assessment & Plan  1. Low lying placenta nos or without hemorrhage, second trimester   2. Vaginal bleeding   3. Bacterial vaginosis    IUP at [redacted]w[redacted]d with vaginal bleeding and abdominal pain.  FHR Cat I, no contractions, good fetal movement.   Ultrasound performed and results normal.  Wet prep +for BV.  GC Chlamydia swab obtained and pending .  Prescribe Flagyl for bacterial vaginosis.    Discharge home in stable condition, strict return precautions reviewed.    Allergies as of 05/30/2016   No Known Allergies     Medication List    STOP taking these medications    naproxen sodium 220 MG tablet Commonly known as:  ANAPROX     TAKE these medications   metroNIDAZOLE 500 MG tablet Commonly known as:  FLAGYL Take 1 tablet (500 mg total) by mouth 2 (two) times daily.   prenatal multivitamin Tabs tablet Take 1 tablet by mouth at bedtime.       Freddrick March, MD PGY-1 05/30/2016 1:57 PM  OB FELLOW DISCHARGE ATTESTATION  I have seen and examined this patient and agree with above documentation in the resident's note.  Ernestina PennaNicholas Tydus Sanmiguel, MD 2:09 PM

## 2016-05-31 LAB — GC/CHLAMYDIA PROBE AMP (~~LOC~~) NOT AT ARMC
CHLAMYDIA, DNA PROBE: NEGATIVE
NEISSERIA GONORRHEA: NEGATIVE

## 2016-06-07 ENCOUNTER — Ambulatory Visit (INDEPENDENT_AMBULATORY_CARE_PROVIDER_SITE_OTHER): Payer: Managed Care, Other (non HMO) | Admitting: Family Medicine

## 2016-06-07 ENCOUNTER — Ambulatory Visit (INDEPENDENT_AMBULATORY_CARE_PROVIDER_SITE_OTHER): Payer: 59 | Admitting: Clinical

## 2016-06-07 ENCOUNTER — Encounter: Payer: Self-pay | Admitting: Family Medicine

## 2016-06-07 VITALS — BP 115/68 | HR 78 | Wt 165.0 lb

## 2016-06-07 DIAGNOSIS — O99342 Other mental disorders complicating pregnancy, second trimester: Secondary | ICD-10-CM

## 2016-06-07 DIAGNOSIS — Z3492 Encounter for supervision of normal pregnancy, unspecified, second trimester: Secondary | ICD-10-CM

## 2016-06-07 DIAGNOSIS — Z23 Encounter for immunization: Secondary | ICD-10-CM

## 2016-06-07 DIAGNOSIS — O34219 Maternal care for unspecified type scar from previous cesarean delivery: Secondary | ICD-10-CM

## 2016-06-07 DIAGNOSIS — Z3490 Encounter for supervision of normal pregnancy, unspecified, unspecified trimester: Secondary | ICD-10-CM | POA: Insufficient documentation

## 2016-06-07 DIAGNOSIS — Z8271 Family history of polycystic kidney: Secondary | ICD-10-CM | POA: Insufficient documentation

## 2016-06-07 DIAGNOSIS — O4442 Low lying placenta NOS or without hemorrhage, second trimester: Secondary | ICD-10-CM

## 2016-06-07 DIAGNOSIS — F322 Major depressive disorder, single episode, severe without psychotic features: Secondary | ICD-10-CM | POA: Diagnosis not present

## 2016-06-07 DIAGNOSIS — F419 Anxiety disorder, unspecified: Secondary | ICD-10-CM

## 2016-06-07 LAB — POCT URINALYSIS DIP (DEVICE)
BILIRUBIN URINE: NEGATIVE
Glucose, UA: 100 mg/dL — AB
HGB URINE DIPSTICK: NEGATIVE
KETONES UR: NEGATIVE mg/dL
Nitrite: NEGATIVE
PH: 7 (ref 5.0–8.0)
PROTEIN: NEGATIVE mg/dL
SPECIFIC GRAVITY, URINE: 1.02 (ref 1.005–1.030)
Urobilinogen, UA: 0.2 mg/dL (ref 0.0–1.0)

## 2016-06-07 NOTE — Progress Notes (Signed)
New OB Note  06/07/2016   Transfer of Care Patient: no  History of Present Illness: Ms. Melanie Moore is a 25 y.o. U9W1191G3P1102 @ 1251w5d weeks (Estimated Date of Delivery: 09/22/16, based on 18 wk US).  No LMP recorded. Patient is pregnant.), with the above CC. Preg complicated by has Previous cesarean delivery, delivered; Low lying placenta nos or without hemorrhage, second trimester; Supervision of low-risk pregnancy; and Family history of polycystic kidney disease on her problem list.   Her periods were: regular periods every 28 days, but them irregular after patch was on.  She was using Patch when she conceived.  She has Negative signs or symptoms of nausea/vomiting of pregnancy. She has Negative signs or symptoms of miscarriage or preterm labor She identifies Negative Zika risk factors for her and her partner  ROS: A 12-point review of systems was performed and negative, except as stated in the above HPI.  OBGYN History: As per HPI. OB History  Gravida Para Term Preterm AB Living  3 2 1 1  0 2  SAB TAB Ectopic Multiple Live Births  0 0 0 0 2    # Outcome Date GA Lbr Len/2nd Weight Sex Delivery Anes PTL Lv  3 Current           2 Term 03/18/15 6844w4d 09:00 / 02:19 7 lb 10.1 oz (3.46 kg) F CS-Vac EPI  LIV  1 Preterm 03/07/13 7059w6d 07:27 / 05:38 6 lb 2.8 oz (2.8 kg) M Vag-Spont EPI  LIV     Birth Comments: WNL      Any prior children are healthy, doing well, without any problems or issues: Yes and no. Has son who had a single kidney and was diagnosed with PCKD as well as mental/speech/developmental delay.  History of pap smears: Yes. Last pap smear 09/2014. Abnormal: no and neg HPV History of STIs: No   Past Medical History: Past Medical History:  Diagnosis Date  . 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 Vaginal Pap smear, abnormal     Past Surgical History: Past Surgical History:  Procedure  Laterality Date  . CESAREAN SECTION N/A 03/18/2015   Procedure: CESAREAN SECTION;  Surgeon: Catalina AntiguaPeggy Constant, MD;  Location: [facility];  Service: Obstetrics;  Laterality: N/A;  . EYE SURGERY  2008   sty removed both eyes    Family History:  Family History  Problem Relation Age of Onset  . Diabetes Father   . Hyperlipidemia Father   . Depression Mother   . Fibromyalgia Mother   . Diabetes Maternal Grandfather   . Diabetes Paternal Grandmother   . Diabetes Paternal Grandfather    She denies any female cancers, bleeding or blood clotting disorders.  She reports history of mental retardation, birth defects or genetic disorders with her son, Denies in the FOB's history  Social History:  Social History   Social History  . Marital status: Single    Spouse name: N/A  . Number of children: N/A  . Years of education: N/A   Occupational History  . Not on file.   Social History Main Topics  . Smoking status: Never Smoker  . Smokeless tobacco: Never Used  . Alcohol use No     Comment: socially before pregnancy  . Drug use: No  . Sexual activity: Not Currently    Birth control/ protection: None   Other Topics Concern  . Not on file  Social History Narrative  . No narrative on file   Any pets in the household: no   Allergy: No Known Allergies  Health Maintenance:  Mammogram Up to Date: not applicable  Current Outpatient Medications: PNV   Physical Exam:   BP 115/68   Pulse 78   Wt 165 lb (74.8 kg)   BMI 25.84 kg/m  Body mass index is 25.84 kg/m. Fundal height: 24cm FHTs: 145 bpm  General appearance: Well nourished, well developed female in no acute distress.  Neck:  Supple, normal appearance, and no thyromegaly  Cardiovascular: S1, S2 normal, no murmur, rub or gallop, regular rate and rhythm Respiratory:  Clear to auscultation bilateral. Normal respiratory effort Abdomen: positive bowel sounds and no masses, hernias; diffusely non tender to palpation, non  distended Breasts: breasts appear normal, no suspicious masses, no skin or nipple changes or axillary nodes. Neuro/Psych:  Normal mood and affect.  Skin:  Warm and dry.  Lymphatic:  No inguinal lymphadenopathy.   Pelvic exam: is not limited by body habitus EGBUS: within normal limits, Vagina: within normal limits and with minimal blood in the vault seen as pink tinged discharge, no overt or acute bleeding noted, Cervix: normal appearing cervix without discharge or lesions, did not perform digital exam 2/2 low-lying placenta, Uterus:  enlarged, c/w 24 week size, and Adnexa:  not evaluated   Assessment/Plan: 1. Encounter for supervision of low-risk pregnancy in second trimester - Initial labs drawn today - Prenatal Profile - Culture, OB Urine - Pain Mgmt, Profile 6 Conf w/o mM, U - Flu Vaccine QUAD 36+ mos IM (Fluarix, Quad PF) - US MFM OB FOLLOW UP; Future  2. Low lying placenta nos or without hemorrhage, Koreasecond trimester - Discussed pelvic rest until it resolves.  - Bleeding precautions given - No acute bleeding seen on exam today, light pink discharge noted. - Prenatal Profile - Culture, OB Urine - Pain Mgmt, Profile 6 Conf w/o mM, U - Flu Vaccine QUAD 36+ mos IM (Fluarix, Quad PF) - US MFM OB FOLLOW UP; Future  3. Previous cesarean delivery, delivered - Emergent 2/2 cord prolapse, patient states she "felt everything" during the entire cesarean, scared about this pregnancy.  4. Family history of polycystic kidney disease - Son has history of high-functioning autism, with single kidney and PCKD - Genetic counseling and MFM consult  Problem list reviewed and updated.  Follow up in 4 weeks.  >50% of 30 min visit spent on counseling and coordination of care.     Cleda ClarksElizabeth W. Kalayna Noy, DO OB Fellow [facility](Faculty Practice)

## 2016-06-07 NOTE — BH Specialist Note (Signed)
Session Start time: 4:30   End Time: 4:55 Total Time:  25 minutes Type of Service: Behavioral Health - Individual/Family Interpreter: No.   Interpreter Name & Language: n/a # [facility]  SUBJECTIVE: Melanie LloydMilka Moore is a 25 y.o. female  Pt. was referred by Dr. Omer JackMumaw  for:  anxiety and depression. Pt. reports the following symptoms/concerns: Pt states that she feels overwhelmed, loss of appetite, tired, poor sleep, depressed, and excessive worry. Pt feels she is "in shock" over finding out she is expecting, and is uncertain where to start to prepare. Duration of problem:  Over two months Severity: severe Previous treatment: none  OBJECTIVE: Mood: Depressed & Affect: Tearful Risk of harm to self or others: No known risk of harm to self or others, denies any current SI or HI, denies any past SI, attributes thoughts of death to feeling overwhelmingly tired, not to thoughts of self-harm Assessments administered: PHQ9: 20/ GAD7: 19  LIFE CONTEXT:  Family & Social: Lives with FOB and two children, helps care for parents (both going through medical issues)  School/ Work: Works Economistfulltime Self-Care: -- Life changes: Current pregnancy What is important to pt/family (values): Overall wellness  GOALS ADDRESSED:  -Reduce symptoms of anxiety and depression  INTERVENTIONS: Psychosocial Skills: Worry Hour strategy and Meditation: CALM relaxation breathing exercise   ASSESSMENT:  Pt currently experiencing Major depressive disorder, severe, single episode, no psychotic features.  Pt may benefit from psychoeducation and brief therapeutic interventions regarding coping with symptoms of anxiety and depression.   PLAN: 1. F/U with behavioral health clinician: Two weeks 2. Behavioral Health meds: none(does not want now) 3. Behavioral recommendations:  -Begin implementing Worry Hour strategy today -Practice daily CALM relaxation breathing exercise, with prenatal vitamins -Read  educational material regarding coping with symptoms of anxiety and depression -Go to Department of Social Services on Friday, to apply for Medicaid 4. Referral: Brief Counseling/Psychotherapy and Psychoeducation 5. From scale of 1-10, how likely are you to follow plan: 8  Woc-Behavioral Health Clinician  Behavioral Health Clinician  Marlon PelWarmhandoff:   Warm Hand Off Completed.      Depression screen [facility]/9 06/07/2016 10/23/2014 10/08/2014  Decreased Interest 3 0 0  Down, Depressed, Hopeless 3 0 0  PHQ - 2 Score 6 0 0  Altered sleeping 3 - -  Tired, decreased energy 3 - -  Change in appetite 3 - -  Feeling bad or failure about yourself  2 - -  Trouble concentrating 2 - -  Moving slowly or fidgety/restless 0 - -  Suicidal thoughts 1 - -  PHQ-9 Score 20 - -   GAD 7 : Generalized Anxiety Score 06/07/2016  Nervous, Anxious, on Edge 3  Control/stop worrying 3  Worry too much - different things 3  Trouble relaxing 3  Restless 1  Easily annoyed or irritable 3  Afraid - awful might happen 3  Total GAD 7 Score 19       Depression screen [facility] 2/9 06/07/2016 10/23/2014 10/08/2014  Decreased Interest 3 0 0  Down, Depressed, Hopeless 3 0 0  PHQ - 2 Score 6 0 0  Altered sleeping 3 - -  Tired, decreased energy 3 - -  Change in appetite 3 - -  Feeling bad or failure about yourself  2 - -  Trouble concentrating 2 - -  Moving slowly or fidgety/restless 0 - -  Suicidal thoughts 1 - -  PHQ-9 Score 20 - -   GAD 7 : Generalized Anxiety  Score 06/07/2016  Nervous, Anxious, on Edge 3  Control/stop worrying 3  Worry too much - different things 3  Trouble relaxing 3  Restless 1  Easily annoyed or irritable 3  Afraid - awful might happen 3  Total GAD 7 Score 19

## 2016-06-07 NOTE — Patient Instructions (Signed)
Placenta Previa Placenta previa is a condition in which the placenta implants in the lower part of the uterus in pregnant women. The placenta either partially or completely covers the opening to the cervix. This is a problem because the baby must pass through the cervix during delivery. There are three types of placenta previa:  Marginal placenta previa. The placenta reaches within an inch (2.5 cm) of the cervical opening but does not cover it.  Partial placenta previa. The placenta covers part of the cervical opening.  Complete placenta previa. The placenta covers the entire cervical opening. If the previa is marginal or partial and it is diagnosed in the first half of pregnancy, the placenta may move into a normal position as the pregnancy progresses and may no longer cover the cervix. It is important to keep all prenatal visits with your health care provider so you can be more closely monitored. What are the causes? The cause of this condition is not known. What increases the risk? This condition is more likely to develop in women who:  Are carrying more than one baby (multiples).  Have an abnormally shaped uterus.  Have scars on the lining of the uterus.  Have had surgeries involving the uterus, such as a cesarean delivery.  Have delivered a baby before.  Have a history of placenta previa.  Have smoked or used cocaine during pregnancy.  Are age 35 or older during pregnancy. What are the signs or symptoms? The main symptom of this condition is sudden, painless vaginal bleeding during the second half of pregnancy. The amount of bleeding can be very light at first, and it usually stops on its own. Heavier bleeding episodes may also happen. Some women with placenta previa may have no bleeding at all. How is this diagnosed?  This condition is diagnosed:  From an ultrasound. This test uses sound waves to find where the placenta is located before you have any bleeding  episodes.  During a checkup after vaginal bleeding is noticed.  If you are diagnosed with a partial or complete previa, digital exams with fingers will generally be avoided. Your health care provider will still perform a speculum exam.  If you did not have an ultrasound during your pregnancy, placenta previa may not be diagnosed until bleeding occurs during labor. How is this treated? Treatment for this condition may include:  Decreased activity.  Bed rest at home or in the hospital.  Pelvic rest. Nothing is placed inside the vagina during pelvic rest. This means not having sex and not using tampons or douches.  A blood transfusion to replace blood that you have lost (maternal blood loss).  A cesarean delivery. This may be performed if:  The bleeding is heavy and cannot be controlled.  The placenta completely covers the cervix.  Medicines to stop premature labor or to help the baby's lungs to mature. This treatment may be used if you need delivery before your pregnancy is full-term. Your treatment will be decided based on:  How much you are bleeding, or whether the bleeding has stopped.  How far along you are in your pregnancy.  The condition of your baby.  The type of placenta previa that you have. Follow these instructions at home:  Get plenty of rest and lessen activity as told by your health care provider.  Stay on bed rest for as long as told by your health care provider.  Do not have sex, use tampons, use a douche, or place anything inside of your   vagina if your health care provider recommended pelvic rest.  Take over-the-counter and prescription medicines as told by your health care provider.  Keep all follow-up visits as told by your health care provider. This is important. Get help right away if:  You have vaginal bleeding, even if in small amounts and even if you have no pain.  You have cramping or regular contractions.  You have pain in your abdomen or  your lower back.  You have a feeling of increased pressure in your pelvis.  You have increased watery or bloody mucus from the vagina. This information is not intended to replace advice given to you by your health care provider. Make sure you discuss any questions you have with your health care provider. Document Released: 05/30/2005 Document Revised: 02/17/2016 Document Reviewed: 12/12/2015 Elsevier Interactive Patient Education  2017 [employer]   Second Trimester of Pregnancy The second trimester is from week 13 through week 28, month 4 through 6. This is often the time in pregnancy that you feel your best. Often times, morning sickness has lessened or quit. You may have more energy, and you may get hungry more often. Your unborn baby (fetus) is growing rapidly. At the end of the sixth month, he or she is about 9 inches long and weighs about 1 pounds. You will likely feel the baby move (quickening) between 18 and 20 weeks of pregnancy. Follow these instructions at home:  Avoid all smoking, herbs, and alcohol. Avoid drugs not approved by your doctor.  Do not use any tobacco products, including cigarettes, chewing tobacco, and electronic cigarettes. If you need help quitting, ask your doctor. You may get counseling or other support to help you quit.  Only take medicine as told by your doctor. Some medicines are safe and some are not during pregnancy.  Exercise only as told by your doctor. Stop exercising if you start having cramps.  Eat regular, healthy meals.  Wear a good support bra if your breasts are tender.  Do not use hot tubs, steam rooms, or saunas.  Wear your seat belt when driving.  Avoid raw meat, uncooked cheese, and liter boxes and soil used by cats.  Take your prenatal vitamins.  Take 1500-2000 milligrams of calcium daily starting at the 20th week of pregnancy until you deliver your baby.  Try taking medicine that helps you poop (stool softener) as needed, and  if your doctor approves. Eat more fiber by eating fresh fruit, vegetables, and whole grains. Drink enough fluids to keep your pee (urine) clear or pale yellow.  Take warm water baths (sitz baths) to soothe pain or discomfort caused by hemorrhoids. Use hemorrhoid cream if your doctor approves.  If you have puffy, bulging veins (varicose veins), wear support hose. Raise (elevate) your feet for 15 minutes, 3-4 times a day. Limit salt in your diet.  Avoid heavy lifting, wear low heals, and sit up straight.  Rest with your legs raised if you have leg cramps or low back pain.  Visit your dentist if you have not gone during your pregnancy. Use a soft toothbrush to brush your teeth. Be gentle when you floss.  You can have sex (intercourse) unless your doctor tells you not to.  Go to your doctor visits. Get help if:  You feel dizzy.  You have mild cramps or pressure in your lower belly (abdomen).  You have a nagging pain in your belly area.  You continue to feel sick to your stomach (nauseous), throw up (vomit),  or have watery poop (diarrhea).  You have bad smelling fluid coming from your vagina.  You have pain with peeing (urination). Get help right away if:  You have a fever.  You are leaking fluid from your vagina.  You have spotting or bleeding from your vagina.  You have severe belly cramping or pain.  You lose or gain weight rapidly.  You have trouble catching your breath and have chest pain.  You notice sudden or extreme puffiness (swelling) of your face, hands, ankles, feet, or legs.  You have not felt the baby move in over an hour.  You have severe headaches that do not go away with medicine.  You have vision changes. This information is not intended to replace advice given to you by your health care provider. Make sure you discuss any questions you have with your health care provider. Document Released: 08/24/2009 Document Revised: 11/05/2015 Document Reviewed:  07/31/2012 Elsevier Interactive Patient Education  2017 [employer]

## 2016-06-08 LAB — PAIN MGMT, PROFILE 6 CONF W/O MM, U
6 ACETYLMORPHINE: NEGATIVE ng/mL (ref <10)
ALCOHOL METABOLITES: NEGATIVE ng/mL (ref <500)
Amphetamines: NEGATIVE ng/mL (ref <500)
Barbiturates: NEGATIVE ng/mL (ref <300)
Benzodiazepines: NEGATIVE ng/mL (ref <100)
COCAINE METABOLITE: NEGATIVE ng/mL (ref <150)
Creatinine: 128.2 mg/dL (ref >=20.0)
MARIJUANA METABOLITE: NEGATIVE ng/mL (ref <20)
METHADONE METABOLITE: NEGATIVE ng/mL (ref <100)
OPIATES: NEGATIVE ng/mL (ref <100)
OXYCODONE: NEGATIVE ng/mL (ref <100)
Oxidant: NEGATIVE ug/mL (ref <200)
PLEASE NOTE: 0
Phencyclidine: NEGATIVE ng/mL (ref <25)
pH: 6.94 (ref 4.5–9.0)

## 2016-06-08 LAB — CULTURE, OB URINE: Organism ID, Bacteria: NO GROWTH

## 2016-06-09 ENCOUNTER — Ambulatory Visit (HOSPITAL_COMMUNITY): Payer: 59 | Attending: Family Medicine

## 2016-06-13 NOTE — L&D Delivery Note (Signed)
Delivery Note At 4:00 PM a viable female was delivered via VBAC, Spontaneous (Presentation: ROA). Anterior shoulder delivered easily.  APGAR: , ; weight pending  Placenta status: intact, spontaneous.  Cord: 3 vessel with the following complications: none  Anesthesia:  epidural Episiotomy:  no Lacerations:  None, periurethral abrasions Suture Repair: n/i Est. Blood Loss (mL):  150  Mom to postpartum.  Baby to Couplet care / Skin to Skin.  Melanie Moore 09/29/2016, 4:24 PM  OB FELLOW DELIVERY ATTESTATION  I was gloved and present for the delivery in its entirety, and I agree with the above resident's note.    Ernestina Penna, MD 5:15 PM

## 2016-06-21 ENCOUNTER — Other Ambulatory Visit: Payer: Managed Care, Other (non HMO)

## 2016-06-21 DIAGNOSIS — Z3492 Encounter for supervision of normal pregnancy, unspecified, second trimester: Secondary | ICD-10-CM

## 2016-06-21 LAB — CBC
HEMATOCRIT: 32.4 % — AB (ref 35.0–45.0)
Hemoglobin: 10.6 g/dL — ABNORMAL LOW (ref 11.7–15.5)
MCH: 27.4 pg (ref 27.0–33.0)
MCHC: 32.7 g/dL (ref 32.0–36.0)
MCV: 83.7 fL (ref 80.0–100.0)
MPV: 9.9 fL (ref 7.5–12.5)
PLATELETS: 226 10*3/uL (ref 140–400)
RBC: 3.87 MIL/uL (ref 3.80–5.10)
RDW: 14.7 % (ref 11.0–15.0)
WBC: 8.5 10*3/uL (ref 3.8–10.8)

## 2016-06-21 MED ORDER — TETANUS-DIPHTH-ACELL PERTUSSIS 5-2.5-18.5 LF-MCG/0.5 IM SUSP
0.5000 mL | Freq: Once | INTRAMUSCULAR | Status: AC
  Administered 2016-07-19: 0.5 mL via INTRAMUSCULAR

## 2016-06-22 LAB — HIV ANTIBODY (ROUTINE TESTING W REFLEX): HIV: NONREACTIVE

## 2016-06-22 LAB — RPR

## 2016-06-22 LAB — 2HR GTT W 1 HR, CARPENTER, 75 G
GLUCOSE, 1 HR, GEST: 142 mg/dL (ref <180)
GLUCOSE, FASTING, GEST: 91 mg/dL (ref 65–91)
Glucose, 2 Hr, Gest: 118 mg/dL (ref <153)

## 2016-07-05 ENCOUNTER — Encounter: Payer: Self-pay | Admitting: Medical

## 2016-07-12 ENCOUNTER — Ambulatory Visit (HOSPITAL_COMMUNITY)
Admission: RE | Admit: 2016-07-12 | Discharge: 2016-07-12 | Disposition: A | Discharge status: Home / Self Care | Payer: Managed Care, Other (non HMO) | Source: Ambulatory Visit | Attending: Family Medicine | Admitting: Family Medicine

## 2016-07-12 DIAGNOSIS — O4443 Low lying placenta NOS or without hemorrhage, third trimester: Secondary | ICD-10-CM | POA: Diagnosis not present

## 2016-07-12 DIAGNOSIS — Z3A29 29 weeks gestation of pregnancy: Secondary | ICD-10-CM | POA: Diagnosis not present

## 2016-07-12 DIAGNOSIS — O4442 Low lying placenta NOS or without hemorrhage, second trimester: Secondary | ICD-10-CM

## 2016-07-12 DIAGNOSIS — Z3492 Encounter for supervision of normal pregnancy, unspecified, second trimester: Secondary | ICD-10-CM

## 2016-07-19 ENCOUNTER — Ambulatory Visit (INDEPENDENT_AMBULATORY_CARE_PROVIDER_SITE_OTHER): Payer: Managed Care, Other (non HMO) | Admitting: Medical

## 2016-07-19 VITALS — BP 111/67 | HR 83 | Wt 163.7 lb

## 2016-07-19 DIAGNOSIS — O4440 Low lying placenta NOS or without hemorrhage, unspecified trimester: Secondary | ICD-10-CM

## 2016-07-19 DIAGNOSIS — Z3493 Encounter for supervision of normal pregnancy, unspecified, third trimester: Secondary | ICD-10-CM

## 2016-07-19 DIAGNOSIS — O4443 Low lying placenta NOS or without hemorrhage, third trimester: Secondary | ICD-10-CM

## 2016-07-19 DIAGNOSIS — Z23 Encounter for immunization: Secondary | ICD-10-CM

## 2016-07-19 NOTE — Progress Notes (Signed)
C/o right legged sciatica? Pain Needs to see Jamie, marked "several days" on thoughts of suicide on PHQ-9. Stated sheAsher Muir did not have a plan and did not intend to hurt herself. Does not want to talk to AlpineJamie today. Vonzella NippleJulie Wenzel and [facility] notified and are aware.

## 2016-07-19 NOTE — Patient Instructions (Signed)
Introduction Patient Name: ________________________________________________ Patient Due Date: ____________________ What is a fetal movement count? A fetal movement count is the number of times that you feel your baby move during a certain amount of time. This may also be called a fetal kick count. A fetal movement count is recommended for every pregnant woman. You may be asked to start counting fetal movements as early as week 28 of your pregnancy. Pay attention to when your baby is most active. You may notice your baby's sleep and wake cycles. You may also notice things that make your baby move more. You should do a fetal movement count:  When your baby is normally most active.  At the same time each day. A good time to count movements is while you are resting, after having something to eat and drink. How do I count fetal movements? 1. Find a quiet, comfortable area. Sit, or lie down on your side. 2. Write down the date, the start time and stop time, and the number of movements that you felt between those two times. Take this information with you to your health care visits. 3. For 2 hours, count kicks, flutters, swishes, rolls, and jabs. You should feel at least 10 movements during 2 hours. 4. You may stop counting after you have felt 10 movements. 5. If you do not feel 10 movements in 2 hours, have something to eat and drink. Then, keep resting and counting for 1 hour. If you feel at least 4 movements during that hour, you may stop counting. Contact a health care provider if:  You feel fewer than 4 movements in 2 hours.  Your baby is not moving like he or she usually does. Date: ____________ Start time: ____________ Stop time: ____________ Movements: ____________ Date: ____________ Start time: ____________ Stop time: ____________ Movements: ____________ Date: ____________ Start time: ____________ Stop time: ____________ Movements: ____________ Date: ____________ Start time: ____________  Stop time: ____________ Movements: ____________ Date: ____________ Start time: ____________ Stop time: ____________ Movements: ____________ Date: ____________ Start time: ____________ Stop time: ____________ Movements: ____________ Date: ____________ Start time: ____________ Stop time: ____________ Movements: ____________ Date: ____________ Start time: ____________ Stop time: ____________ Movements: ____________ Date: ____________ Start time: ____________ Stop time: ____________ Movements: ____________ This information is not intended to replace advice given to you by your health care provider. Make sure you discuss any questions you have with your health care provider. Document Released: 06/29/2006 Document Revised: 01/27/2016 Document Reviewed: 07/09/2015 Elsevier Interactive Patient Education  2017 [employer] Braxton Hicks Contractions Contractions of the uterus can occur throughout pregnancy. Contractions are not always a sign that you are in labor.  WHAT ARE BRAXTON HICKS CONTRACTIONS?  Contractions that occur before labor are called Braxton Hicks contractions, or false labor. Toward the end of pregnancy (32-34 weeks), these contractions can develop more often and may become more forceful. This is not true labor because these contractions do not result in opening (dilatation) and thinning of the cervix. They are sometimes difficult to tell apart from true labor because these contractions can be forceful and people have different pain tolerances. You should not feel embarrassed if you go to the hospital with false labor. Sometimes, the only way to tell if you are in true labor is for your health care provider to look for changes in the cervix. If there are no prenatal problems or other health problems associated with the pregnancy, it is completely safe to be sent home with false labor and await the onset of true labor.   HOW CAN YOU TELL THE DIFFERENCE BETWEEN TRUE AND FALSE LABOR? False Labor     The contractions of false labor are usually shorter and not as hard as those of true labor.   The contractions are usually irregular.   The contractions are often felt in the front of the lower abdomen and in the groin.   The contractions may go away when you walk around or change positions while lying down.   The contractions get weaker and are shorter lasting as time goes on.   The contractions do not usually become progressively stronger, regular, and closer together as with true labor.  True Labor   Contractions in true labor last 30-70 seconds, become very regular, usually become more intense, and increase in frequency.   The contractions do not go away with walking.   The discomfort is usually felt in the top of the uterus and spreads to the lower abdomen and low back.   True labor can be determined by your health care provider with an exam. This will show that the cervix is dilating and getting thinner.  WHAT TO REMEMBER  Keep up with your usual exercises and follow other instructions given by your health care provider.   Take medicines as directed by your health care provider.   Keep your regular prenatal appointments.   Eat and drink lightly if you think you are going into labor.   If Braxton Hicks contractions are making you uncomfortable:   Change your position from lying down or resting to walking, or from walking to resting.   Sit and rest in a tub of warm water.   Drink 2-3 glasses of water. Dehydration may cause these contractions.   Do slow and deep breathing several times an hour.  WHEN SHOULD I SEEK IMMEDIATE MEDICAL CARE? Seek immediate medical care if:  Your contractions become stronger, more regular, and closer together.   You have fluid leaking or gushing from your vagina.   You have a fever.   You pass blood-tinged mucus.   You have vaginal bleeding.   You have continuous abdominal pain.   You have low back pain  that you never had before.   You feel your baby's head pushing down and causing pelvic pressure.   Your baby is not moving as much as it used to.  This information is not intended to replace advice given to you by your health care provider. Make sure you discuss any questions you have with your health care provider. Document Released: 05/30/2005 Document Revised: 09/21/2015 Document Reviewed: 03/11/2013 Elsevier Interactive Patient Education  2017 Elsevier Inc[employer]  

## 2016-07-19 NOTE — Progress Notes (Signed)
   PRENATAL VISIT NOTE  Subjective:  Melanie Moore is a 26 y.o. G3P1102 at 785w5d being seen today for ongoing prenatal care.  She is currently monitored for the following issues for this high-risk pregnancy and has Previous cesarean delivery, delivered; Low lying placenta nos or without hemorrhage, second trimester; Supervision of low-risk pregnancy; Family history of polycystic kidney disease; and Anxiety in pregnancy, antepartum, second trimester on her problem list.  Patient reports backache.  Contractions: Irritability. Vag. Bleeding: None.  Movement: Present. Denies leaking of fluid.   The following portions of the patient's history were reviewed and updated as appropriate: allergies, current medications, past family history, past medical history, past social history, past surgical history and problem list. Problem list updated.  Objective:   Vitals:   07/19/16 0857  BP: 111/67  Pulse: 83  Weight: 163 lb 11.2 oz (74.3 kg)    Fetal Status: Fetal Heart Rate (bpm): 153 Fundal Height: 30 cm Movement: Present     General:  Alert, oriented and cooperative. Patient is in no acute distress.  Skin: Skin is warm and dry. No rash noted.   Cardiovascular: Normal heart rate noted  Respiratory: Normal respiratory effort, no problems with respiration noted  Abdomen: Soft, gravid, appropriate for gestational age. Pain/Pressure: Present     Pelvic:  Cervical exam deferred        Extremities: Normal range of motion.  Edema: None  Mental Status: Normal mood and affect. Normal behavior. Normal judgment and thought content.   Assessment and Plan:  Pregnancy: G3P1102 at 455w5d  1. Encounter for supervision of low-risk pregnancy in third trimester - Prenatal Profile I, drawn today, was not obtained at first visit  2. Low-lying placenta - US MFM OB FOLLOW UP; scheduled for end of February  3. Sciatica, right - Discussed use of Tylenol, abdominal binder, heat to the back and moderation of  activity  4. Depression - Patient has seen Asher MuirJamie in the past and will plan to see at next visit, patient refuses to see Asher MuirJamie today  Preterm labor symptoms and general obstetric precautions including but not limited to vaginal bleeding, contractions, leaking of fluid and fetal movement were reviewed in detail with the patient. Please refer to After Visit Summary for other counseling recommendations.  Return in about 2 weeks (around 08/02/2016) for LOB and see Asher MuirJamie.   Marny LowensteinJulie N Erhard Senske, PA-C

## 2016-07-20 LAB — PRENATAL PROFILE (SOLSTAS)
ANTIBODY SCREEN: NEGATIVE
BASOS ABS: 0 {cells}/uL (ref 0–200)
Basophils Relative: 0 %
EOS ABS: 0 {cells}/uL — AB (ref 15–500)
EOS PCT: 0 %
HCT: 32 % — ABNORMAL LOW (ref 35.0–45.0)
HIV 1&2 Ab, 4th Generation: NONREACTIVE
Hemoglobin: 10.3 g/dL — ABNORMAL LOW (ref 11.7–15.5)
Hepatitis B Surface Ag: NEGATIVE
LYMPHS PCT: 18 %
Lymphs Abs: 1746 cells/uL (ref 850–3900)
MCH: 26.6 pg — ABNORMAL LOW (ref 27.0–33.0)
MCHC: 32.2 g/dL (ref 32.0–36.0)
MCV: 82.7 fL (ref 80.0–100.0)
MONOS PCT: 7 %
MPV: 9.9 fL (ref 7.5–12.5)
Monocytes Absolute: 679 cells/uL (ref 200–950)
Neutro Abs: 7275 cells/uL (ref 1500–7800)
Neutrophils Relative %: 75 %
PLATELETS: 234 10*3/uL (ref 140–400)
RBC: 3.87 MIL/uL (ref 3.80–5.10)
RDW: 14.8 % (ref 11.0–15.0)
RH TYPE: POSITIVE
Rubella: 3.36 Index — ABNORMAL HIGH (ref <0.90)
WBC: 9.7 10*3/uL (ref 3.8–10.8)

## 2016-08-03 ENCOUNTER — Ambulatory Visit (INDEPENDENT_AMBULATORY_CARE_PROVIDER_SITE_OTHER): Payer: Self-pay | Admitting: Clinical

## 2016-08-03 ENCOUNTER — Ambulatory Visit (INDEPENDENT_AMBULATORY_CARE_PROVIDER_SITE_OTHER): Payer: Managed Care, Other (non HMO) | Admitting: Advanced Practice Midwife

## 2016-08-03 VITALS — BP 99/61 | HR 84 | Wt 171.0 lb

## 2016-08-03 DIAGNOSIS — O99342 Other mental disorders complicating pregnancy, second trimester: Secondary | ICD-10-CM

## 2016-08-03 DIAGNOSIS — F419 Anxiety disorder, unspecified: Secondary | ICD-10-CM

## 2016-08-03 DIAGNOSIS — O4442 Low lying placenta NOS or without hemorrhage, second trimester: Secondary | ICD-10-CM

## 2016-08-03 DIAGNOSIS — O34219 Maternal care for unspecified type scar from previous cesarean delivery: Secondary | ICD-10-CM

## 2016-08-03 DIAGNOSIS — F324 Major depressive disorder, single episode, in partial remission: Secondary | ICD-10-CM

## 2016-08-03 NOTE — Progress Notes (Signed)
   PRENATAL VISIT NOTE  Subjective:  Melanie LloydMilka Kuhlmann is a 26 y.o. G3P1102 at 5666w6d being seen today for ongoing prenatal care.  She is currently monitored for the following issues for this low-risk pregnancy and has Previous cesarean delivery, delivered; Low lying placenta nos or without hemorrhage, second trimester; Supervision of low-risk pregnancy; Family history of polycystic kidney disease; and Anxiety in pregnancy, antepartum, second trimester on her problem list.  Patient reports no complaints.  Contractions: Irritability. Vag. Bleeding: None.  Movement: Present. Denies leaking of fluid.   The following portions of the patient's history were reviewed and updated as appropriate: allergies, current medications, past family history, past medical history, past social history, past surgical history and problem list. Problem list updated.  Objective:   Vitals:   08/03/16 1009  BP: 99/61  Pulse: 84  Weight: 171 lb (77.6 kg)    Fetal Status: Fetal Heart Rate (bpm): 146 Fundal Height: 32 cm Movement: Present     General:  Alert, oriented and cooperative. Patient is in no acute distress.  Skin: Skin is warm and dry. No rash noted.   Cardiovascular: Normal heart rate noted  Respiratory: Normal respiratory effort, no problems with respiration noted  Abdomen: Soft, gravid, appropriate for gestational age. Pain/Pressure: Present     Pelvic:  Cervical exam deferred        Extremities: Normal range of motion.  Edema: None  Mental Status: Normal mood and affect. Normal behavior. Normal judgment and thought content.   Assessment and Plan:  Pregnancy: G3P1102 at 2366w6d  1. Anxiety in pregnancy, antepartum, second trimester --To see Asher MuirJamie today after visit.  2. Low lying placenta nos or without hemorrhage, second trimester --1.6 cm from os on 1/30.  US scheduled 3/2 for follow up.  3. Previous cesarean delivery, delivered --Had emergency C/S after laboring last pregnancy and this was  traumatic.  Plans repeat C/S because wants controlled delivery if possible.  Could change her mind if she comes in laboring prior to scheduled C/S.  Pt given VBAC consent form today to review, discussed benefits/risks in brief with pt today.  Preterm labor symptoms and general obstetric precautions including but not limited to vaginal bleeding, contractions, leaking of fluid and fetal movement were reviewed in detail with the patient. Please refer to After Visit Summary for other counseling recommendations.  Return in about 2 weeks (around 08/17/2016).   Hurshel PartyLisa A Leftwich-Kirby, CNM

## 2016-08-03 NOTE — BH Specialist Note (Signed)
Session Start time: 11:15   End Time: 12:10 Total Time:  55 minutes Type of Service: Behavioral Health - Individual/Family Interpreter: No.   Interpreter Name & Language: n/a # [facility]  SUBJECTIVE: Melanie Moore is a 26 y.o. female  Pt. was referred by Dr Omer JackMumaw for:  anxiety and depression. Pt. reports the following symptoms/concerns: Pt states that she is feeling much improved emotions since last visit; coping by doing 10 minute meditation apps every morning, and learning to focus on things in life that she has control, rather than things out of her control; has come to terms with current pregnancy, and accepting possibility of another cesarean. Duration of problem:  About 3 months; reduction about 2 months Severity: moderate Previous treatment: none  OBJECTIVE: Mood: Appropriate & Affect: Appropriate Risk of harm to self or others: No known risk of harm to self or others Assessments administered: PHQ9: 17/ GAD7: 9  LIFE CONTEXT:  Family & Social: Lives with FOB and two children, still helps care for parents; sister very supportive in helping with setting up daily routine; mother has history of depression School/ Work: Working fulltime for as long as able, up to birth; FOB also working  Self-Care: 10 minute meditation app daily; phone calls to family on lunch break Life changes: Current pregnancy What is important to pt/family (values): Overall wellness  GOALS ADDRESSED:  -Continue to reduce symptoms of anxiety and depression  INTERVENTIONS: Solution Focused   ASSESSMENT:  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  Pt may benefit from Continued brief therapeutic interventions regarding coping with symptoms of depression and anxiety.   PLAN: 1. F/U with behavioral health clinician: Two weeks 2. Behavioral Health meds: none 3. Behavioral recommendations:  -Continue practicing 10 minute meditation  app, every morning -Continue to call children during work lunch break -Continue to keep daily routine, in writing(look at it and follow daily) -If routine is interrupted, go back to it the next day 4. Referral: Brief Counseling/Psychotherapy 5. From scale of 1-10, how likely are you to follow plan: 9  Rae LipsJamie C Maanav Kassabian LCSWA Behavioral Health Clinician  Marlon PelWarmhandoff: no  Depression screen [facility] 2/9 08/03/2016 07/19/2016 06/07/2016 10/23/2014 10/08/2014  Decreased Interest 3 3 3  0 0  Down, Depressed, Hopeless 3 3 3  0 0  PHQ - 2 Score 6 6 6  0 0  Altered sleeping 2 3 3  - -  Tired, decreased energy 3 2 3  - -  Change in appetite 2 1 3  - -  Feeling bad or failure about yourself  2 2 2  - -  Trouble concentrating 1 1 2  - -  Moving slowly or fidgety/restless 0 0 0 - -  Suicidal thoughts 1 1 1  - -  PHQ-9 Score 17 16 20  - -   GAD 7 : Generalized Anxiety Score 08/03/2016 07/19/2016 06/07/2016  Nervous, Anxious, on Edge 1 3 3   Control/stop worrying 1 3 3   Worry too much - different things 2 3 3   Trouble relaxing 1 2 3   Restless 0 1 1  Easily annoyed or irritable 3 3 3   Afraid - awful might happen 1 2 3   Total GAD 7 Score 9 17 19

## 2016-08-03 NOTE — Patient Instructions (Signed)
Third Trimester of Pregnancy The third trimester is from week 29 through week 40 (months 7 through 9). The third trimester is a time when the unborn baby (fetus) is growing rapidly. At the end of the ninth month, the fetus is about 20 inches in length and weighs 6-10 pounds. Body changes during your third trimester Your body goes through many changes during pregnancy. The changes vary from woman to woman. During the third trimester:  Your weight will continue to increase. You can expect to gain 25-35 pounds (11-16 kg) by the end of the pregnancy.  You may begin to get stretch marks on your hips, abdomen, and breasts.  You may urinate more often because the fetus is moving lower into your pelvis and pressing on your bladder.  You may develop or continue to have heartburn. This is caused by increased hormones that slow down muscles in the digestive tract.  You may develop or continue to have constipation because increased hormones slow digestion and cause the muscles that push waste through your intestines to relax.  You may develop hemorrhoids. These are swollen veins (varicose veins) in the rectum that can itch or be painful.  You may develop swollen, bulging veins (varicose veins) in your legs.  You may have increased body aches in the pelvis, back, or thighs. This is due to weight gain and increased hormones that are relaxing your joints.  You may have changes in your hair. These can include thickening of your hair, rapid growth, and changes in texture. Some women also have hair loss during or after pregnancy, or hair that feels dry or thin. Your hair will most likely return to normal after your baby is born.  Your breasts will continue to grow and they will continue to become tender. A yellow fluid (colostrum) may leak from your breasts. This is the first milk you are producing for your baby.  Your belly button may stick out.  You may notice more swelling in your hands, face, or  ankles.  You may have increased tingling or numbness in your hands, arms, and legs. The skin on your belly may also feel numb.  You may feel short of breath because of your expanding uterus.  You may have more problems sleeping. This can be caused by the size of your belly, increased need to urinate, and an increase in your body's metabolism.  You may notice the fetus "dropping," or moving lower in your abdomen.  You may have increased vaginal discharge.  Your cervix becomes thin and soft (effaced) near your due date. What to expect at prenatal visits You will have prenatal exams every 2 weeks until week 36. Then you will have weekly prenatal exams. During a routine prenatal visit:  You will be weighed to make sure you and the fetus are growing normally.  Your blood pressure will be taken.  Your abdomen will be measured to track your baby's growth.  The fetal heartbeat will be listened to.  Any test results from the previous visit will be discussed.  You may have a cervical check near your due date to see if you have effaced. At around 36 weeks, your health care provider will check your cervix. At the same time, your health care provider will also perform a test on the secretions of the vaginal tissue. This test is to determine if a type of bacteria, Group B streptococcus, is present. Your health care provider will explain this further. Your health care provider may ask you:    What your birth plan is.  How you are feeling.  If you are feeling the baby move.  If you have had any abnormal symptoms, such as leaking fluid, bleeding, severe headaches, or abdominal cramping.  If you are using any tobacco products, including cigarettes, chewing tobacco, and electronic cigarettes.  If you have any questions. Other tests or screenings that may be performed during your third trimester include:  Blood tests that check for low iron levels (anemia).  Fetal testing to check the health,  activity level, and growth of the fetus. Testing is done if you have certain medical conditions or if there are problems during the pregnancy.  Nonstress test (NST). This test checks the health of your baby to make sure there are no signs of problems, such as the baby not getting enough oxygen. During this test, a belt is placed around your belly. The baby is made to move, and its heart rate is monitored during movement. What is false labor? False labor is a condition in which you feel small, irregular tightenings of the muscles in the womb (contractions) that eventually go away. These are called Braxton Hicks contractions. Contractions may last for hours, days, or even weeks before true labor sets in. If contractions come at regular intervals, become more frequent, increase in intensity, or become painful, you should see your health care provider. What are the signs of labor?  Abdominal cramps.  Regular contractions that start at 10 minutes apart and become stronger and more frequent with time.  Contractions that start on the top of the uterus and spread down to the lower abdomen and back.  Increased pelvic pressure and dull back pain.  A watery or bloody mucus discharge that comes from the vagina.  Leaking of amniotic fluid. This is also known as your "water breaking." It could be a slow trickle or a gush. Let your doctor know if it has a color or strange odor. If you have any of these signs, call your health care provider right away, even if it is before your due date. Follow these instructions at home: Eating and drinking  Continue to eat regular, healthy meals.  Do not eat:  Raw meat or meat spreads.  Unpasteurized milk or cheese.  Unpasteurized juice.  Store-made salad.  Refrigerated smoked seafood.  Hot dogs or deli meat, unless they are piping hot.  More than 6 ounces of albacore tuna a week.  Shark, swordfish, king mackerel, or tile fish.  Store-made salads.  Raw  sprouts, such as mung bean or alfalfa sprouts.  Take prenatal vitamins as told by your health care provider.  Take 1000 mg of calcium daily as told by your health care provider.  If you develop constipation:  Take over-the-counter or prescription medicines.  Drink enough fluid to keep your urine clear or pale yellow.  Eat foods that are high in fiber, such as fresh fruits and vegetables, whole grains, and beans.  Limit foods that are high in fat and processed sugars, such as fried and sweet foods. Activity  Exercise only as directed by your health care provider. Healthy pregnant women should aim for 2 hours and 30 minutes of moderate exercise per week. If you experience any pain or discomfort while exercising, stop.  Avoid heavy lifting.  Do not exercise in extreme heat or humidity, or at high altitudes.  Wear low-heel, comfortable shoes.  Practice good posture.  Do not travel far distances unless it is absolutely necessary and only with the approval   of your health care provider.  Wear your seat belt at all times while in a car, on a bus, or on a plane.  Take frequent breaks and rest with your legs elevated if you have leg cramps or low back pain.  Do not use hot tubs, steam rooms, or saunas.  You may continue to have sex unless your health care provider tells you otherwise. Lifestyle  Do not use any products that contain nicotine or tobacco, such as cigarettes and e-cigarettes. If you need help quitting, ask your health care provider.  Do not drink alcohol.  Do not use any medicinal herbs or unprescribed drugs. These chemicals affect the formation and growth of the baby.  If you develop varicose veins:  Wear support pantyhose or compression stockings as told by your healthcare provider.  Elevate your feet for 15 minutes, 3-4 times a day.  Wear a supportive maternity bra to help with breast tenderness. General instructions  Take over-the-counter and prescription  medicines only as told by your health care provider. There are medicines that are either safe or unsafe to take during pregnancy.  Take warm sitz baths to soothe any pain or discomfort caused by hemorrhoids. Use hemorrhoid cream or witch hazel if your health care provider approves.  Avoid cat litter boxes and soil used by cats. These carry germs that can cause birth defects in the baby. If you have a cat, ask someone to clean the litter box for you.  To prepare for the arrival of your baby:  Take prenatal classes to understand, practice, and ask questions about the labor and delivery.  Make a trial run to the hospital.  Visit the hospital and tour the maternity area.  Arrange for maternity or paternity leave through employers.  Arrange for family and friends to take care of pets while you are in the hospital.  Purchase a rear-facing car seat and make sure you know how to install it in your car.  Pack your hospital bag.  Prepare the baby's nursery. Make sure to remove all pillows and stuffed animals from the baby's crib to prevent suffocation.  Visit your dentist if you have not gone during your pregnancy. Use a soft toothbrush to brush your teeth and be gentle when you floss.  Keep all prenatal follow-up visits as told by your health care provider. This is important. Contact a health care provider if:  You are unsure if you are in labor or if your water has broken.  You become dizzy.  You have mild pelvic cramps, pelvic pressure, or nagging pain in your abdominal area.  You have lower back pain.  You have persistent nausea, vomiting, or diarrhea.  You have an unusual or bad smelling vaginal discharge.  You have pain when you urinate. Get help right away if:  You have a fever.  You are leaking fluid from your vagina.  You have spotting or bleeding from your vagina.  You have severe abdominal pain or cramping.  You have rapid weight loss or weight gain.  You have  shortness of breath with chest pain.  You notice sudden or extreme swelling of your face, hands, ankles, feet, or legs.  Your baby makes fewer than 10 movements in 2 hours.  You have severe headaches that do not go away with medicine.  You have vision changes. Summary  The third trimester is from week 29 through week 40, months 7 through 9. The third trimester is a time when the unborn baby (fetus)   is growing rapidly.  During the third trimester, your discomfort may increase as you and your baby continue to gain weight. You may have abdominal, leg, and back pain, sleeping problems, and an increased need to urinate.  During the third trimester your breasts will keep growing and they will continue to become tender. A yellow fluid (colostrum) may leak from your breasts. This is the first milk you are producing for your baby.  False labor is a condition in which you feel small, irregular tightenings of the muscles in the womb (contractions) that eventually go away. These are called Braxton Hicks contractions. Contractions may last for hours, days, or even weeks before true labor sets in.  Signs of labor can include: abdominal cramps; regular contractions that start at 10 minutes apart and become stronger and more frequent with time; watery or bloody mucus discharge that comes from the vagina; increased pelvic pressure and dull back pain; and leaking of amniotic fluid. This information is not intended to replace advice given to you by your health care provider. Make sure you discuss any questions you have with your health care provider. Document Released: 05/24/2001 Document Revised: 11/05/2015 Document Reviewed: 07/31/2012 Else[employer] Interactive Patient Education  2017 [employer]  

## 2016-08-05 ENCOUNTER — Ambulatory Visit (HOSPITAL_COMMUNITY): Payer: 59

## 2016-08-09 ENCOUNTER — Inpatient Hospital Stay (HOSPITAL_COMMUNITY)
Admission: AD | Admit: 2016-08-09 | Discharge: 2016-08-09 | Disposition: A | Discharge status: Home / Self Care | Payer: Managed Care, Other (non HMO) | Source: Ambulatory Visit | Attending: Family Medicine | Admitting: Family Medicine

## 2016-08-09 ENCOUNTER — Encounter (HOSPITAL_COMMUNITY): Payer: Self-pay

## 2016-08-09 DIAGNOSIS — Z3A33 33 weeks gestation of pregnancy: Secondary | ICD-10-CM

## 2016-08-09 DIAGNOSIS — Z3689 Encounter for other specified antenatal screening: Secondary | ICD-10-CM

## 2016-08-09 DIAGNOSIS — Z3493 Encounter for supervision of normal pregnancy, unspecified, third trimester: Secondary | ICD-10-CM

## 2016-08-09 DIAGNOSIS — O9989 Other specified diseases and conditions complicating pregnancy, childbirth and the puerperium: Secondary | ICD-10-CM

## 2016-08-09 DIAGNOSIS — R51 Headache: Secondary | ICD-10-CM

## 2016-08-09 DIAGNOSIS — O26893 Other specified pregnancy related conditions, third trimester: Secondary | ICD-10-CM | POA: Diagnosis not present

## 2016-08-09 LAB — CBC
HCT: 29.5 % — ABNORMAL LOW (ref 36.0–46.0)
Hemoglobin: 10 g/dL — ABNORMAL LOW (ref 12.0–15.0)
MCH: 26.8 pg (ref 26.0–34.0)
MCHC: 33.9 g/dL (ref 30.0–36.0)
MCV: 79.1 fL (ref 78.0–100.0)
PLATELETS: 233 10*3/uL (ref 150–400)
RBC: 3.73 MIL/uL — AB (ref 3.87–5.11)
RDW: 14.9 % (ref 11.5–15.5)
WBC: 10.6 10*3/uL — AB (ref 4.0–10.5)

## 2016-08-09 LAB — COMPREHENSIVE METABOLIC PANEL
ALBUMIN: 3.2 g/dL — AB (ref 3.5–5.0)
ALT: 10 U/L — AB (ref 14–54)
AST: 17 U/L (ref 15–41)
Alkaline Phosphatase: 105 U/L (ref 38–126)
Anion gap: 6 (ref 5–15)
BUN: 11 mg/dL (ref 6–20)
CHLORIDE: 107 mmol/L (ref 101–111)
CO2: 22 mmol/L (ref 22–32)
CREATININE: 0.5 mg/dL (ref 0.44–1.00)
Calcium: 9.4 mg/dL (ref 8.9–10.3)
GFR calc Af Amer: >60 mL/min (ref >60)
GFR calc non Af Amer: >60 mL/min (ref >60)
GLUCOSE: 68 mg/dL (ref 65–99)
POTASSIUM: 3.9 mmol/L (ref 3.5–5.1)
Sodium: 135 mmol/L (ref 135–145)
Total Bilirubin: 0.4 mg/dL (ref 0.3–1.2)
Total Protein: 6.7 g/dL (ref 6.5–8.1)

## 2016-08-09 LAB — WET PREP, GENITAL
SPERM: NONE SEEN
TRICH WET PREP: NONE SEEN
Yeast Wet Prep HPF POC: NONE SEEN

## 2016-08-09 LAB — URINALYSIS, ROUTINE W REFLEX MICROSCOPIC
Bilirubin Urine: NEGATIVE
Glucose, UA: NEGATIVE mg/dL
Hgb urine dipstick: NEGATIVE
KETONES UR: NEGATIVE mg/dL
LEUKOCYTES UA: NEGATIVE
NITRITE: NEGATIVE
PROTEIN: NEGATIVE mg/dL
Specific Gravity, Urine: 1.015 (ref 1.005–1.030)
pH: 6 (ref 5.0–8.0)

## 2016-08-09 LAB — PROTEIN / CREATININE RATIO, URINE
Creatinine, Urine: 84 mg/dL
Protein Creatinine Ratio: 0.1 mg/mg{Cre} (ref 0.00–0.15)
Total Protein, Urine: 8 mg/dL

## 2016-08-09 MED ORDER — DEXAMETHASONE 0.5 MG PO TABS: 1.0000 mg | ORAL_TABLET | Freq: Once | ORAL | Status: DC

## 2016-08-09 MED ORDER — DIPHENHYDRAMINE HCL 25 MG PO CAPS
25.0000 mg | ORAL_CAPSULE | Freq: Four times a day (QID) | ORAL | Status: DC | PRN
  Administered 2016-08-09: 25 mg via ORAL
  Filled 2016-08-09: qty 1

## 2016-08-09 MED ORDER — BUTALBITAL-APAP-CAFFEINE 50-325-40 MG PO TABS: 1.0000 | ORAL_TABLET | Freq: Four times a day (QID) | ORAL | 0 refills | Status: DC | PRN

## 2016-08-09 MED ORDER — METOCLOPRAMIDE HCL 10 MG PO TABS
10.0000 mg | ORAL_TABLET | Freq: Three times a day (TID) | ORAL | Status: DC | PRN
  Administered 2016-08-09: 10 mg via ORAL
  Filled 2016-08-09: qty 1

## 2016-08-09 MED ORDER — DEXAMETHASONE 6 MG PO TABS
6.0000 mg | ORAL_TABLET | Freq: Once | ORAL | Status: AC
  Administered 2016-08-09: 6 mg via ORAL
  Filled 2016-08-09: qty 1

## 2016-08-09 NOTE — Discharge Instructions (Signed)
Third Trimester of Pregnancy °The third trimester is from week 29 through week 42, months 7 through 9. This trimester is when your unborn baby (fetus) is growing very fast. At the end of the ninth month, the unborn baby is about 20 inches in length. It weighs about 6-10 pounds. °Follow these instructions at home: °· Avoid all smoking, herbs, and alcohol. Avoid drugs not approved by your doctor. °· Do not use any tobacco products, including cigarettes, chewing tobacco, and electronic cigarettes. If you need help quitting, ask your doctor. You may get counseling or other support to help you quit. °· Only take medicine as told by your doctor. Some medicines are safe and some are not during pregnancy. °· Exercise only as told by your doctor. Stop exercising if you start having cramps. °· Eat regular, healthy meals. °· Wear a good support bra if your breasts are tender. °· Do not use hot tubs, steam rooms, or saunas. °· Wear your seat belt when driving. °· Avoid raw meat, uncooked cheese, and liter boxes and soil used by cats. °· Take your prenatal vitamins. °· Take 1500-2000 milligrams of calcium daily starting at the 20th week of pregnancy until you deliver your baby. °· Try taking medicine that helps you poop (stool softener) as needed, and if your doctor approves. Eat more fiber by eating fresh fruit, vegetables, and whole grains. Drink enough fluids to keep your pee (urine) clear or pale yellow. °· Take warm water baths (sitz baths) to soothe pain or discomfort caused by hemorrhoids. Use hemorrhoid cream if your doctor approves. °· If you have puffy, bulging veins (varicose veins), wear support hose. Raise (elevate) your feet for 15 minutes, 3-4 times a day. Limit salt in your diet. °· Avoid heavy lifting, wear low heels, and sit up straight. °· Rest with your legs raised if you have leg cramps or low back pain. °· Visit your dentist if you have not gone during your pregnancy. Use a soft toothbrush to brush your  teeth. Be gentle when you floss. °· You can have sex (intercourse) unless your doctor tells you not to. °· Do not travel far distances unless you must. Only do so with your doctor's approval. °· Take prenatal classes. °· Practice driving to the hospital. °· Pack your hospital bag. °· Prepare the baby's room. °· Go to your doctor visits. °Get help if: °· You are not sure if you are in labor or if your water has broken. °· You are dizzy. °· You have mild cramps or pressure in your lower belly (abdominal). °· You have a nagging pain in your belly area. °· You continue to feel sick to your stomach (nauseous), throw up (vomit), or have watery poop (diarrhea). °· You have bad smelling fluid coming from your vagina. °· You have pain with peeing (urination). °Get help right away if: °· You have a fever. °· You are leaking fluid from your vagina. °· You are spotting or bleeding from your vagina. °· You have severe belly cramping or pain. °· You lose or gain weight rapidly. °· You have trouble catching your breath and have chest pain. °· You notice sudden or extreme puffiness (swelling) of your face, hands, ankles, feet, or legs. °· You have not felt the baby move in over an hour. °· You have severe headaches that do not go away with medicine. °· You have vision changes. °This information is not intended to replace advice given to you by your health care provider. Make   sure you discuss any questions you have with your health care provider. Document Released: 08/24/2009 Document Revised: 11/05/2015 Document Reviewed: 07/31/2012 Else[employer] Interactive Patient Education  2017 [employer] Migraine Headache A migraine headache is a very strong throbbing pain on one side or both sides of your head. Migraines can also cause other symptoms. Talk with your doctor about what things may bring on (trigger) your migraine headaches. Follow these instructions at home: Medicines   Take over-the-counter and prescription medicines  only as told by your doctor.  Do not drive or use heavy machinery while taking prescription pain medicine.  To prevent or treat constipation while you are taking prescription pain medicine, your doctor may recommend that you:  Drink enough fluid to keep your pee (urine) clear or pale yellow.  Take over-the-counter or prescription medicines.  Eat foods that are high in fiber. These include fresh fruits and vegetables, whole grains, and beans.  Limit foods that are high in fat and processed sugars. These include fried and sweet foods. Lifestyle   Avoid alcohol.  Do not use any products that contain nicotine or tobacco, such as cigarettes and e-cigarettes. If you need help quitting, ask your doctor.  Get at least 8 hours of sleep every night.  Limit your stress. General instructions    Keep a journal to find out what may bring on your migraines. For example, write down:  What you eat and drink.  How much sleep you get.  Any change in what you eat or drink.  Any change in your medicines.  If you have a migraine:  Avoid things that make your symptoms worse, such as bright lights.  It may help to lie down in a dark, quiet room.  Do not drive or use heavy machinery.  Ask your doctor what activities are safe for you.  Keep all follow-up visits as told by your doctor. This is important. Contact a doctor if:  You get a migraine that is different or worse than your usual migraines. Get help right away if:  Your migraine gets very bad.  You have a fever.  You have a stiff neck.  You have trouble seeing.  Your muscles feel weak or like you cannot control them.  You start to lose your balance a lot.  You start to have trouble walking.  You pass out (faint). This information is not intended to replace advice given to you by your health care provider. Make sure you discuss any questions you have with your health care provider. Document Released: 03/08/2008 Document  Revised: 12/18/2015 Document Reviewed: 11/16/2015 [employer] Interactive Patient Education  2017 [employer]

## 2016-08-09 NOTE — MAU Note (Signed)
Urine in lab 

## 2016-08-09 NOTE — MAU Provider Note (Signed)
History     CSN: [id number]  Arrival date and time: 08/09/16 1424   First Provider Initiated Contact with Patient 08/09/16 [phone]      Chief Complaint  Patient presents with  . Headache   G3P1102 @33 .5 weeks here with HA and pelvic pain. She reports HA started last night but was able to sleep through it. She awoke this am and HA returned. She describes as constant pain that starts in occipital region and radiates to temporal region. Rates 9/10. She reports onset of blurry vision this afternoon. She denies loss of motor function. No LOC. Denies auras but reports seeing floaters upon arising this am. Has sensitivity to light. No N/V. She took 1 Tylenol around 0900 then another around 1130 and had no relief. She admits to consuming only 1 bottle of water today. She also reports pelvic pain. This is not a new symptom and she has been using a maternity support belt to help the discomfort. She reports occ. BH ctx. No LOF. Good FM. She reports pink spotting x3 days. No recent IC. Denies vaginal discharge, itching, or malodor.    OB History    Gravida Para Term Preterm AB Living   3 2 1 1  0 2   SAB TAB Ectopic Multiple Live Births   0 0 0 0 2      Past Medical History:  Diagnosis Date  . 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 Vaginal Pap smear, abnormal     Past Surgical History:  Procedure Laterality Date  . CESAREAN SECTION N/A 03/18/2015   Procedure: CESAREAN SECTION;  Surgeon: Catalina Antigua, MD;  Location: [facility];  Service: Obstetrics;  Laterality: N/A;  . EYE SURGERY  2008   sty removed both eyes    Family History  Problem Relation Age of Onset  . Diabetes Father   . Hyperlipidemia Father   . Depression Mother   . Fibromyalgia Mother   . Diabetes Maternal Grandfather   . Diabetes Paternal Grandmother   . Diabetes Paternal Grandfather     Social History  Substance Use Topics  . Smoking  status: Never Smoker  . Smokeless tobacco: Never Used  . Alcohol use No     Comment: socially before pregnancy    Allergies: No Known Allergies  Prescriptions Prior to Admission  Medication Sig Dispense Refill Last Dose  . Acetaminophen (TYLENOL PO) Take 1 tablet by mouth every 6 (six) hours as needed. For mild pain   08/09/2016 at Unknown time  . Prenatal Vit-Fe Fumarate-FA (PRENATAL MULTIVITAMIN) TABS Take 1 tablet by mouth at bedtime.   08/09/2016 at Unknown time    Review of Systems  Eyes: Positive for visual disturbance.  Gastrointestinal: Positive for abdominal pain (occ. RUQ under rib cage).  Genitourinary: Positive for pelvic pain and vaginal bleeding. Negative for vaginal discharge.  Neurological: Positive for headaches. Negative for dizziness, weakness and numbness.   Physical Exam   Blood pressure 117/69, pulse 80, temperature 98.5 F (36.9 C), temperature source Oral, resp. rate 16, height 5\' 7"  (1.702 m), weight 77 kg (169 lb 12 oz), SpO2 99 %, currently breastfeeding.  Physical Exam  Nursing note and vitals reviewed. Constitutional: She is oriented to person, place, and time. She appears well-developed and well-nourished. No distress.  HENT:  Head: Normocephalic and atraumatic.  Eyes: Pupils are equal, round, and reactive to light. Right eye exhibits normal extraocular motion and  no nystagmus. Left eye exhibits normal extraocular motion and no nystagmus.  Neck: Normal range of motion.  Cardiovascular: Normal rate.   Respiratory: Effort normal.  GI: Soft. She exhibits no distension. There is no tenderness.  gravid  Genitourinary:  Genitourinary Comments: External: no lesions or erythema Vagina: rugated, parous, small thin pink/brown discharge, small areas of friability on cervix SVE: closed/thick   Musculoskeletal: Normal range of motion. She exhibits no edema.  Neurological: She is alert and oriented to person, place, and time. She has normal strength and normal  reflexes. No sensory deficit. Coordination normal.  Skin: Skin is warm and dry.  Psychiatric: She has a normal mood and affect.   EFM: 140 bpm, mod variability, + accels, no decels Toco: none  Results for orders placed or performed during the hospital encounter of 08/09/16 (from the past 24 hour(s))  Urinalysis, Routine w reflex microscopic     Status: None   Collection Time: 08/09/16  2:45 PM  Result Value Ref Range   Color, Urine YELLOW YELLOW   APPearance CLEAR CLEAR   Specific Gravity, Urine 1.015 1.005 - 1.030   pH 6.0 5.0 - 8.0   Glucose, UA NEGATIVE NEGATIVE mg/dL   Hgb urine dipstick NEGATIVE NEGATIVE   Bilirubin Urine NEGATIVE NEGATIVE   Ketones, ur NEGATIVE NEGATIVE mg/dL   Protein, ur NEGATIVE NEGATIVE mg/dL   Nitrite NEGATIVE NEGATIVE   Leukocytes, UA NEGATIVE NEGATIVE  Protein / creatinine ratio, urine     Status: None   Collection Time: 08/09/16  2:45 PM  Result Value Ref Range   Creatinine, Urine 84.00 mg/dL   Total Protein, Urine 8 mg/dL   Protein Creatinine Ratio 0.10 0.00 - 0.15 mg/mg[Cre]  Wet prep, genital     Status: Abnormal   Collection Time: 08/09/16  3:46 PM  Result Value Ref Range   Yeast Wet Prep HPF POC NONE SEEN NONE SEEN   Trich, Wet Prep NONE SEEN NONE SEEN   Clue Cells Wet Prep HPF POC PRESENT (A) NONE SEEN   WBC, Wet Prep HPF POC MANY (A) NONE SEEN   Sperm NONE SEEN   CBC     Status: Abnormal   Collection Time: 08/09/16  4:01 PM  Result Value Ref Range   WBC 10.6 (H) 4.0 - 10.5 K/uL   RBC 3.73 (L) 3.87 - 5.11 MIL/uL   Hemoglobin 10.0 (L) 12.0 - 15.0 g/dL   HCT 16.129.5 (L) 09.636.0 - 04.546.0 %   MCV 79.1 78.0 - 100.0 fL   MCH 26.8 26.0 - 34.0 pg   MCHC 33.9 30.0 - 36.0 g/dL   RDW 40.914.9 81.111.5 - 91.415.5 %   Platelets 233 150 - 400 K/uL  Comprehensive metabolic panel     Status: Abnormal   Collection Time: 08/09/16  4:01 PM  Result Value Ref Range   Sodium 135 135 - 145 mmol/L   Potassium 3.9 3.5 - 5.1 mmol/L   Chloride 107 101 - 111 mmol/L   CO2 22  22 - 32 mmol/L   Glucose, Bld 68 65 - 99 mg/dL   BUN 11 6 - 20 mg/dL   Creatinine, Ser 7.820.50 0.44 - 1.00 mg/dL   Calcium 9.4 8.9 - 95.610.3 mg/dL   Total Protein 6.7 6.5 - 8.1 g/dL   Albumin 3.2 (L) 3.5 - 5.0 g/dL   AST 17 15 - 41 U/L   ALT 10 (L) 14 - 54 U/L   Alkaline Phosphatase 105 38 - 126 U/L   Total Bilirubin 0.4  0.3 - 1.2 mg/dL   GFR calc non Af Amer >60 >60 mL/min   GFR calc Af Amer >60 >60 mL/min   Anion gap 6 5 - 15   MAU Course  Procedures Reglan Decadron Phenergan  MDM Labs ordered and reviewed. No evidence of pre-e. HA likely migraine. Pt reports improvement of HA since meds. Rx Fioricet (easier to admin compared to Benadryl/Reglan/Tylenol). No signs of PTL. Pain likely physiologic to progressing pregnancy and multip status. Continue use of maternity support belt.   Assessment and Plan   1. [redacted] weeks gestation of pregnancy   2. Encounter for supervision of low-risk pregnancy in third trimester   3. Pregnancy headache in third trimester   4. NST (non-stress test) reactive    Discharge home Follow up in office next week as scheduled Rx Fioricet Increase po hydration to 5-6 bottles/day Maternity support belt PTL precautions  Allergies as of 08/09/2016   No Known Allergies     Medication List    STOP taking these medications   TYLENOL PO     TAKE these medications   butalbital-acetaminophen-caffeine 50-325-40 MG tablet Commonly known as:  FIORICET, ESGIC Take 1-2 tablets by mouth every 6 (six) hours as needed for headache.   prenatal multivitamin Tabs tablet Take 1 tablet by mouth at bedtime.      Donette Larry, CNM 08/09/2016, 3:28 PM

## 2016-08-09 NOTE — MAU Note (Signed)
Basically, she has a really bad headache, like can't focus/blurring and mentally, lights are bothering.  Denies epigastric pain or swelling.  Took Tylenol, 1 pill at 0900 one at 1130

## 2016-08-10 LAB — GC/CHLAMYDIA PROBE AMP (~~LOC~~) NOT AT ARMC
Chlamydia: NEGATIVE
Neisseria Gonorrhea: NEGATIVE

## 2016-08-12 ENCOUNTER — Other Ambulatory Visit: Payer: Self-pay | Admitting: Medical

## 2016-08-12 ENCOUNTER — Encounter (HOSPITAL_COMMUNITY): Payer: Self-pay

## 2016-08-12 ENCOUNTER — Ambulatory Visit (HOSPITAL_COMMUNITY)
Admission: RE | Admit: 2016-08-12 | Discharge: 2016-08-12 | Disposition: A | Discharge status: Home / Self Care | Payer: Managed Care, Other (non HMO) | Source: Ambulatory Visit | Attending: Medical | Admitting: Medical

## 2016-08-12 DIAGNOSIS — O0933 Supervision of pregnancy with insufficient antenatal care, third trimester: Secondary | ICD-10-CM | POA: Insufficient documentation

## 2016-08-12 DIAGNOSIS — Z362 Encounter for other antenatal screening follow-up: Secondary | ICD-10-CM

## 2016-08-12 DIAGNOSIS — O4440 Low lying placenta NOS or without hemorrhage, unspecified trimester: Secondary | ICD-10-CM

## 2016-08-12 DIAGNOSIS — O34219 Maternal care for unspecified type scar from previous cesarean delivery: Secondary | ICD-10-CM

## 2016-08-12 DIAGNOSIS — Z3A34 34 weeks gestation of pregnancy: Secondary | ICD-10-CM | POA: Insufficient documentation

## 2016-08-12 DIAGNOSIS — O4443 Low lying placenta NOS or without hemorrhage, third trimester: Secondary | ICD-10-CM | POA: Insufficient documentation

## 2016-08-12 DIAGNOSIS — O34211 Maternal care for low transverse scar from previous cesarean delivery: Secondary | ICD-10-CM | POA: Insufficient documentation

## 2016-08-17 ENCOUNTER — Ambulatory Visit (INDEPENDENT_AMBULATORY_CARE_PROVIDER_SITE_OTHER): Payer: Managed Care, Other (non HMO) | Admitting: Advanced Practice Midwife

## 2016-08-17 VITALS — BP 115/61 | HR 87 | Wt 173.0 lb

## 2016-08-17 DIAGNOSIS — O4442 Low lying placenta NOS or without hemorrhage, second trimester: Secondary | ICD-10-CM

## 2016-08-17 DIAGNOSIS — O34219 Maternal care for unspecified type scar from previous cesarean delivery: Secondary | ICD-10-CM

## 2016-08-17 DIAGNOSIS — F324 Major depressive disorder, single episode, in partial remission: Secondary | ICD-10-CM

## 2016-08-17 NOTE — Patient Instructions (Signed)
For cold symptoms: Sudafed (pseuphedrine) behind the pharmacy counter for congestion Mucinex (guaifenesine)  Benadryl  Saline nasal spray Tylenol  Upper Respiratory Infection, Adult Most upper respiratory infections (URIs) are a viral infection of the air passages leading to the lungs. A URI affects the nose, throat, and upper air passages. The most common type of URI is nasopharyngitis and is typically referred to as "the common cold." URIs run their course and usually go away on their own. Most of the time, a URI does not require medical attention, but sometimes a bacterial infection in the upper airways can follow a viral infection. This is called a secondary infection. Sinus and middle ear infections are common types of secondary upper respiratory infections. Bacterial pneumonia can also complicate a URI. A URI can worsen asthma and chronic obstructive pulmonary disease (COPD). Sometimes, these complications can require emergency medical care and may be life threatening. What are the causes? Almost all URIs are caused by viruses. A virus is a type of germ and can spread from one person to another. What increases the risk? You may be at risk for a URI if:  You smoke.  You have chronic heart or lung disease.  You have a weakened defense (immune) system.  You are very young or very old.  You have nasal allergies or asthma.  You work in crowded or poorly ventilated areas.  You work in health care facilities or schools. What are the signs or symptoms? Symptoms typically develop 2-3 days after you come in contact with a cold virus. Most viral URIs last 7-10 days. However, viral URIs from the influenza virus (flu virus) can last 14-18 days and are typically more severe. Symptoms may include:  Runny or stuffy (congested) nose.  Sneezing.  Cough.  Sore throat.  Headache.  Fatigue.  Fever.  Loss of appetite.  Pain in your forehead, behind your eyes, and over your cheekbones  (sinus pain).  Muscle aches. How is this diagnosed? Your health care provider may diagnose a URI by:  Physical exam.  Tests to check that your symptoms are not due to another condition such as:  Strep throat.  Sinusitis.  Pneumonia.  Asthma. How is this treated? A URI goes away on its own with time. It cannot be cured with medicines, but medicines may be prescribed or recommended to relieve symptoms. Medicines may help:  Reduce your fever.  Reduce your cough.  Relieve nasal congestion. Follow these instructions at home:  Take medicines only as directed by your health care provider.  Gargle warm saltwater or take cough drops to comfort your throat as directed by your health care provider.  Use a warm mist humidifier or inhale steam from a shower to increase air moisture. This may make it easier to breathe.  Drink enough fluid to keep your urine clear or pale yellow.  Eat soups and other clear broths and maintain good nutrition.  Rest as needed.  Return to work when your temperature has returned to normal or as your health care provider advises. You may need to stay home longer to avoid infecting others. You can also use a face mask and careful hand washing to prevent spread of the virus.  Increase the usage of your inhaler if you have asthma.  Do not use any tobacco products, including cigarettes, chewing tobacco, or electronic cigarettes. If you need help quitting, ask your health care provider. How is this prevented? The best way to protect yourself from getting a cold is to  practice good hygiene.  Avoid oral or hand contact with people with cold symptoms.  Wash your hands often if contact occurs. There is no clear evidence that vitamin C, vitamin E, echinacea, or exercise reduces the chance of developing a cold. However, it is always recommended to get plenty of rest, exercise, and practice good nutrition. Contact a health care provider if:  You are getting  worse rather than better.  Your symptoms are not controlled by medicine.  You have chills.  You have worsening shortness of breath.  You have brown or red mucus.  You have yellow or brown nasal discharge.  You have pain in your face, especially when you bend forward.  You have a fever.  You have swollen neck glands.  You have pain while swallowing.  You have white areas in the back of your throat. Get help right away if:  You have severe or persistent:  Headache.  Ear pain.  Sinus pain.  Chest pain.  You have chronic lung disease and any of the following:  Wheezing.  Prolonged cough.  Coughing up blood.  A change in your usual mucus.  You have a stiff neck.  You have changes in your:  Vision.  Hearing.  Thinking.  Mood. This information is not intended to replace advice given to you by your health care provider. Make sure you discuss any questions you have with your health care provider. Document Released: 11/23/2000 Document Revised: 01/31/2016 Document Reviewed: 09/04/2013 Elsevier Interactive Patient Education  2017 [employer].

## 2016-08-17 NOTE — Progress Notes (Signed)
PRENATAL VISIT NOTE  Subjective:  Melanie Moore is a 26 y.o. G3P1102 at [redacted]w[redacted]d being seen today for ongoing prenatal care.  She is currently monitored for the following issues for this low-risk pregnancy and has Previous cesarean delivery, delivered; Low lying placenta nos or without hemorrhage, second trimester; Supervision of low-risk pregnancy; Family history of polycystic kidney disease; and Anxiety in pregnancy, antepartum, second trimester on her problem list.  Patient reports pelvic and back pain that is unchanged from previous visits.  .  Contractions: Irritability. Vag. Bleeding: None.  Movement: Present. Denies leaking of fluid.   The following portions of the patient's history were reviewed and updated as appropriate: allergies, current medications, past family history, past medical history, past social history, past surgical history and problem list. Problem list updated.  Objective:   Vitals:   08/17/16 0817  BP: 115/61  Pulse: 87  Weight: 173 lb (78.5 kg)    Fetal Status: Fetal Heart Rate (bpm): 148   Movement: Present     General:  Alert, oriented and cooperative. Patient is in no acute distress.  Skin: Skin is warm and dry. No rash noted.   Cardiovascular: Normal heart rate noted  Respiratory: Normal respiratory effort, no problems with respiration noted  Abdomen: Soft, gravid, appropriate for gestational age. Pain/Pressure: Present     Pelvic:  Cervical exam deferred        Extremities: Normal range of motion.  Edema: None  Mental Status: Normal mood and affect. Normal behavior. Normal judgment and thought content.   Assessment and Plan:  Pregnancy: G3P1102 at [redacted]w[redacted]d  1. Major depressive disorder with single episode, in partial remission [facility]) --Reviewed Medicaid Home form results. Pt reports sometimes feeling like "why am I here" or even feeling like it would be better if she were not here.  But, she denies any plan to do herself or anyone else any harm. She  states "I just feel low so it makes me feel like no one needs me anyway."  She reports she is doing better now than in early pregnancy.  --She has a family counselor, Eugenie Filler, but has not seen her in a while. Reports she will call her this week and set up appointment.  Encouraged pt to contact office if counselor does not accept her insurance or she desires further assistance finding behavioral health resources.  She enjoys speaking with [facility] in our office and reports this has helped.  Asher Muir is not here today but pt reports she is doing well and does not need to speak with her.  --Discussed starting SSRI now at the end of pregnancy so will be working soon after she delivers.  She declines Rx for medication.  Reports she wants to try counseling again and will let us know if she needs more.  2. Low lying placenta nos or without hemorrhage, second trimester --[place] on 3/2 shows placenta is 1.5 cm from os.  Provider dependent decision to allow her to labor per Dr Sherrie George.  Reviewed with Dr Alysia Penna. Plan to [place] at 37.5 to evaluated placenta again.  Pt now desires TOLAC if low lying placenta resolves enough to do so.   - [place] MFM OB FOLLOW UP; Future  3. Previous cesarean delivery, delivered --Pt had traumatic experience with stat c/s with last birth.  Had traumatic experience after her vaginal delivery with first baby because the baby transferred to [facility] with problems.  She is undecided but is now leaning towards a TOLAC if  her low lying placenta resolves--see above. --Consent given to pt and reviewed but not signed.  - [place] MFM OB FOLLOW UP; Future  Preterm labor symptoms and general obstetric precautions including but not limited to vaginal bleeding, contractions, leaking of fluid and fetal movement were reviewed in detail with the patient. Please refer to After Visit Summary for other counseling recommendations.  Return in about 2 weeks (around 08/31/2016).   Hurshel PartyLisa A Leftwich-Kirby, CNM

## 2016-08-17 NOTE — Progress Notes (Signed)
Pt having cold symptoms. Reviewed rooming in with patient for breastfeeding education.

## 2016-09-01 ENCOUNTER — Ambulatory Visit (INDEPENDENT_AMBULATORY_CARE_PROVIDER_SITE_OTHER): Payer: Managed Care, Other (non HMO) | Admitting: Advanced Practice Midwife

## 2016-09-01 ENCOUNTER — Other Ambulatory Visit (HOSPITAL_COMMUNITY)
Admission: RE | Admit: 2016-09-01 | Discharge: 2016-09-01 | Disposition: A | Discharge status: Home / Self Care | Payer: Managed Care, Other (non HMO) | Source: Ambulatory Visit | Attending: Advanced Practice Midwife | Admitting: Advanced Practice Midwife

## 2016-09-01 VITALS — BP 116/74 | HR 76 | Wt 175.2 lb

## 2016-09-01 DIAGNOSIS — Z3403 Encounter for supervision of normal first pregnancy, third trimester: Secondary | ICD-10-CM

## 2016-09-01 DIAGNOSIS — Z3A37 37 weeks gestation of pregnancy: Secondary | ICD-10-CM | POA: Diagnosis not present

## 2016-09-01 DIAGNOSIS — Z3483 Encounter for supervision of other normal pregnancy, third trimester: Secondary | ICD-10-CM | POA: Diagnosis present

## 2016-09-01 DIAGNOSIS — O34219 Maternal care for unspecified type scar from previous cesarean delivery: Secondary | ICD-10-CM

## 2016-09-01 LAB — OB RESULTS CONSOLE GC/CHLAMYDIA: GC PROBE AMP, GENITAL: NEGATIVE

## 2016-09-01 LAB — OB RESULTS CONSOLE GBS: STREP GROUP B AG: NEGATIVE

## 2016-09-01 NOTE — Patient Instructions (Signed)

## 2016-09-01 NOTE — Progress Notes (Signed)
   PRENATAL VISIT NOTE  Subjective:  Melanie Moore is a 26 y.o. G3P1102 at 8335w0d being seen today for ongoing prenatal care.  She is currently monitored for the following issues for this high-risk pregnancy and has Previous cesarean delivery, delivered; Low lying placenta nos or without hemorrhage, second trimester; Supervision of low-risk pregnancy; Family history of polycystic kidney disease; and Anxiety in pregnancy, antepartum, second trimester on her problem list.  Patient reports Lots of fetal movement, nervousness about birth.  Contractions: Not present. Vag. Bleeding: None.  Movement: Present. Denies leaking of fluid.   The following portions of the patient's history were reviewed and updated as appropriate: allergies, current medications, past family history, past medical history, past social history, past surgical history and problem list. Problem list updated.  Objective:   Vitals:   09/01/16 0824  BP: 116/74  Pulse: 76  Weight: 175 lb 3.2 oz (79.5 kg)    Fetal Status: Fetal Heart Rate (bpm): 146   Movement: Present     General:  Alert, oriented and cooperative. Patient is in no acute distress.  Skin: Skin is warm and dry. No rash noted.   Cardiovascular: Normal heart rate noted  Respiratory: Normal respiratory effort, no problems with respiration noted  Abdomen: Soft, gravid, appropriate for gestational age. Pain/Pressure: Present     Pelvic:  Cervical exam performed        1-2/40-50/-3  Extremities: Normal range of motion.  Edema: Trace  Mental Status: Normal mood and affect. Normal behavior. Normal judgment and thought content.   Assessment and Plan:  Pregnancy: G3P1102 at 835w0d  1. Supervision of low-risk first pregnancy, third trimester  - Culture, beta strep (group b only) - [facility]Chlam[facility] ()[facility]  2. 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 Please refer to After Visit Summary for other counseling recommendations.  Return in about 1 week (around 09/08/2016) for [facility].   Aviva SignsMarie L Navya Timmons, CNM

## 2016-09-02 LAB — GC/CHLAMYDIA PROBE AMP (~~LOC~~) NOT AT ARMC
CHLAMYDIA, DNA PROBE: NEGATIVE
NEISSERIA GONORRHEA: NEGATIVE

## 2016-09-05 ENCOUNTER — Ambulatory Visit (HOSPITAL_COMMUNITY)
Admission: RE | Admit: 2016-09-05 | Discharge: 2016-09-05 | Disposition: A | Discharge status: Home / Self Care | Payer: Managed Care, Other (non HMO) | Source: Ambulatory Visit | Attending: Advanced Practice Midwife | Admitting: Advanced Practice Midwife

## 2016-09-05 ENCOUNTER — Other Ambulatory Visit: Payer: Self-pay | Admitting: Advanced Practice Midwife

## 2016-09-05 DIAGNOSIS — Z3A37 37 weeks gestation of pregnancy: Secondary | ICD-10-CM | POA: Insufficient documentation

## 2016-09-05 DIAGNOSIS — O4442 Low lying placenta NOS or without hemorrhage, second trimester: Secondary | ICD-10-CM

## 2016-09-05 DIAGNOSIS — O34211 Maternal care for low transverse scar from previous cesarean delivery: Secondary | ICD-10-CM | POA: Insufficient documentation

## 2016-09-05 DIAGNOSIS — O34219 Maternal care for unspecified type scar from previous cesarean delivery: Secondary | ICD-10-CM

## 2016-09-05 DIAGNOSIS — O0933 Supervision of pregnancy with insufficient antenatal care, third trimester: Secondary | ICD-10-CM | POA: Diagnosis not present

## 2016-09-05 DIAGNOSIS — O4443 Low lying placenta NOS or without hemorrhage, third trimester: Secondary | ICD-10-CM | POA: Insufficient documentation

## 2016-09-05 DIAGNOSIS — F324 Major depressive disorder, single episode, in partial remission: Secondary | ICD-10-CM

## 2016-09-05 LAB — CULTURE, BETA STREP (GROUP B ONLY): STREP GP B CULTURE: NEGATIVE

## 2016-09-08 ENCOUNTER — Ambulatory Visit (INDEPENDENT_AMBULATORY_CARE_PROVIDER_SITE_OTHER): Payer: Managed Care, Other (non HMO) | Admitting: Family Medicine

## 2016-09-08 VITALS — BP 116/69 | HR 78 | Wt 178.3 lb

## 2016-09-08 DIAGNOSIS — Z3483 Encounter for supervision of other normal pregnancy, third trimester: Secondary | ICD-10-CM

## 2016-09-08 DIAGNOSIS — O34219 Maternal care for unspecified type scar from previous cesarean delivery: Secondary | ICD-10-CM

## 2016-09-08 DIAGNOSIS — Z3403 Encounter for supervision of normal first pregnancy, third trimester: Secondary | ICD-10-CM

## 2016-09-08 NOTE — Progress Notes (Signed)
Patient declined seeing Jamie today. °

## 2016-09-08 NOTE — Progress Notes (Signed)
   PRENATAL VISIT NOTE  Subjective:  Melanie Moore is a 26 y.o. G3P1102 at 741w0d being seen today for ongoing prenatal care.  She is currently monitored for the following issues for this high-risk pregnancy and has Previous cesarean delivery, delivered; Low lying placenta nos or without hemorrhage, second trimester; Supervision of low-risk pregnancy; Family history of polycystic kidney disease; and Anxiety in pregnancy, antepartum, second trimester on her problem list.  Patient reports occasional contractions.  Contractions: Irritability. Vag. Bleeding: None, Scant.  Movement: Present. Denies leaking of fluid.   The following portions of the patient's history were reviewed and updated as appropriate: allergies, current medications, past family history, past medical history, past social history, past surgical history and problem list. Problem list updated.  Objective:   Vitals:   09/08/16 1457  BP: 116/69  Pulse: 78  Weight: 178 lb 4.8 oz (80.9 kg)    Fetal Status: Fetal Heart Rate (bpm): 143   Movement: Present  Presentation: Vertex  General:  Alert, oriented and cooperative. Patient is in no acute distress.  Skin: Skin is warm and dry. No rash noted.   Cardiovascular: Normal heart rate noted  Respiratory: Normal respiratory effort, no problems with respiration noted  Abdomen: Soft, gravid, appropriate for gestational age. Pain/Pressure: Present     Pelvic:  Cervical exam performed Dilation: 2 Effacement (%): 50 Station: Ballotable  Extremities: Normal range of motion.  Edema: Trace  Mental Status: Normal mood and affect. Normal behavior. Normal judgment and thought content.   Assessment and Plan:  Pregnancy: G3P1102 at 4141w0d  1. Supervision of low-risk first pregnancy, third trimester FHT and FH normal.  2. Previous cesarean delivery, delivered VBAC discussed, including risks. Consent signed.  Term labor symptoms and general obstetric precautions including but not limited to  vaginal bleeding, contractions, leaking of fluid and fetal movement were reviewed in detail with the patient. Please refer to After Visit Summary for other counseling recommendations.  Return in about 1 week (around 09/15/2016) for LR OB f/u.   Levie HeritageJacob J Stinson, DO

## 2016-09-15 ENCOUNTER — Ambulatory Visit (INDEPENDENT_AMBULATORY_CARE_PROVIDER_SITE_OTHER): Payer: Managed Care, Other (non HMO) | Admitting: Family Medicine

## 2016-09-15 VITALS — BP 116/75 | HR 80 | Wt 180.0 lb

## 2016-09-15 DIAGNOSIS — O34219 Maternal care for unspecified type scar from previous cesarean delivery: Secondary | ICD-10-CM

## 2016-09-15 DIAGNOSIS — Z3493 Encounter for supervision of normal pregnancy, unspecified, third trimester: Secondary | ICD-10-CM

## 2016-09-15 NOTE — Progress Notes (Signed)
   PRENATAL VISIT NOTE  Subjective:  Melanie Moore is a 26 y.o. G3P1102 at [redacted]w[redacted]d being seen today for ongoing prenatal care.  She is currently monitored for the following issues for this low-risk pregnancy and has Previous cesarean delivery, delivered; Low lying placenta nos or without hemorrhage, second trimester; Supervision of low-risk pregnancy; Family history of polycystic kidney disease; and Anxiety in pregnancy, antepartum, second trimester on her problem list.  Patient reports occasional contractions.  Contractions: Irritability. Vag. Bleeding: None.  Movement: Present. Denies leaking of fluid.   The following portions of the patient's history were reviewed and updated as appropriate: allergies, current medications, past family history, past medical history, past social history, past surgical history and problem list. Problem list updated.  Objective:   Vitals:   09/15/16 1422  BP: 116/75  Pulse: 80  Weight: 180 lb (81.6 kg)    Fetal Status: Fetal Heart Rate (bpm): 148 Fundal Height: 39 cm Movement: Present  Presentation: Vertex  General:  Alert, oriented and cooperative. Patient is in no acute distress.  Skin: Skin is warm and dry. No rash noted.   Cardiovascular: Normal heart rate noted  Respiratory: Normal respiratory effort, no problems with respiration noted  Abdomen: Soft, gravid, appropriate for gestational age. Pain/Pressure: Present     Pelvic:  Cervical exam performed Dilation: 3 Effacement (%): 50 Station: -3  Extremities: Normal range of motion.  Edema: Trace  Mental Status: Normal mood and affect. Normal behavior. Normal judgment and thought content.   Assessment and Plan:  Pregnancy: G3P1102 at [redacted]w[redacted]d  1. Encounter for supervision of low-risk pregnancy in third trimester FHT and FH normal. Wants VBAC. Consent on chart.  Term labor symptoms and general obstetric precautions including but not limited to vaginal bleeding, contractions, leaking of fluid and fetal  movement were reviewed in detail with the patient. Please refer to After Visit Summary for other counseling recommendations.  No Follow-up on file.   Levie Heritage, DO

## 2016-09-22 ENCOUNTER — Encounter: Payer: Self-pay | Admitting: Advanced Practice Midwife

## 2016-09-22 ENCOUNTER — Ambulatory Visit (INDEPENDENT_AMBULATORY_CARE_PROVIDER_SITE_OTHER): Payer: Managed Care, Other (non HMO) | Admitting: Advanced Practice Midwife

## 2016-09-22 VITALS — BP 120/75 | HR 75 | Wt 181.9 lb

## 2016-09-22 DIAGNOSIS — Z3493 Encounter for supervision of normal pregnancy, unspecified, third trimester: Secondary | ICD-10-CM

## 2016-09-22 NOTE — Patient Instructions (Signed)
Augmentation of Labor Augmentation of labor is when steps are taken to stimulate and strengthen uterine contractions during labor. This may be done when the contractions have slowed down or stopped, delaying progress of labor and delivery of the baby. Before beginning augmentation of labor, the health care provider will evaluate the condition of the mother and baby, the size and position of the baby, and the size of the birth canal. What are the reasons for labor augmentation? Reasons for augmentation of labor include:  Slow labor (prolonged first and second stage of labor) that has been associated with increased maternal risks, such as chorioamnionitis, postpartum hemorrhage, operative vaginal delivery, or third-degree or fourth-degree perineal lacerations.  Decreased average length of labor.  What methods are used for labor augmentation? Various methods may be used for augmentation of labor, including:  Oxytocin medicine. This medicine stimulates contractions. It is given through an IV access tube inserted into a vein.  Breaking the fluid-filled sac that surrounds the fetus (amniotic sac).  Stripping the membranes. The health care provider separates amniotic sac tissue from the cervix, causing the release of a hormone called progesterone that can stimulate uterine contractions.  Nipple stimulation.  Stimulation of certain pressure points on the ankles.  Manual or mechanical dilation of the cervix.  What are the risks associated with labor augmentation?  Overstimulation of the uterine contractions (continuous, prolonged, very strong contractions), causing fetal distress.  Increased chance of infection for the mother and baby.  Uterine tearing (rupture).  Breaking off (abruption) of the placenta.  Increased chance of cesarean, forceps, or vacuum delivery. What are some reasons for not doing labor augmentation? Augmentation of labor should not be done if:  The baby is too big for  the birth canal. This can be confirmed by ultrasonography.  The umbilical cord drops in front of the baby's head or breech part (prolapsed cord).  The mother had a previous cesarean delivery with a vertical incision in the uterus (or the kind of incision used is not known). High dose oxytocin should not be used if the mother had a previous cesarean delivery of any kind.  The mother had previous surgery on or into the uterus.  The mother has herpes.  The mother has cervical cancer.  The baby is lying sideways.  The mother's pelvis is deformed.  The mother is pregnant with more than two babies.  This information is not intended to replace advice given to you by your health care provider. Make sure you discuss any questions you have with your health care provider. Document Released: 11/22/2006 Document Revised: 11/11/2015 Document Reviewed: 12/27/2012 Elsevier Interactive Patient Education  2017 Elsevier Inc.  

## 2016-09-22 NOTE — Progress Notes (Signed)
Scheduled for IOL 09/29/16 0700 and nst for 09/26/16.

## 2016-09-22 NOTE — Progress Notes (Signed)
   PRENATAL VISIT NOTE  Subjective:  Melanie Moore is a 26 y.o. G3P1102 at [redacted]w[redacted]d being seen today for ongoing prenatal care.  She is currently monitored for the following issues for this high-risk pregnancy and has Previous cesarean delivery, delivered; Low lying placenta nos or without hemorrhage, second trimester; Supervision of low-risk pregnancy; Family history of polycystic kidney disease; and Anxiety in pregnancy, antepartum, second trimester on her problem list.  Patient reports no complaints.  Contractions: Irregular. Vag. Bleeding: None.  Movement: Present. Denies leaking of fluid.   The following portions of the patient's history were reviewed and updated as appropriate: allergies, current medications, past family history, past medical history, past social history, past surgical history and problem list. Problem list updated.  Objective:   Vitals:   09/22/16 1415  BP: 120/75  Pulse: 75  Weight: 181 lb 14.4 oz (82.5 kg)    Fetal Status: Fetal Heart Rate (bpm): 136   Movement: Present     General:  Alert, oriented and cooperative. Patient is in no acute distress.  Skin: Skin is warm and dry. No rash noted.   Cardiovascular: Normal heart rate noted  Respiratory: Normal respiratory effort, no problems with respiration noted  Abdomen: Soft, gravid, appropriate for gestational age. Pain/Pressure: Present     Pelvic:  Cervical exam performed         Cervix 3/70/-3/vertex  Extremities: Normal range of motion.  Edema: Trace  Mental Status: Normal mood and affect. Normal behavior. Normal judgment and thought content.   Assessment and Plan:  Pregnancy: G3P1102 at [redacted]w[redacted]d  1. Encounter for supervision of low-risk pregnancy in third trimester      Reviewed post dated induction and NST prior to induction      Told to let provider know she is nervous about prior delivery and may want reassurance of presentation  Term labor symptoms and general obstetric precautions including but not  limited to vaginal bleeding, contractions, leaking of fluid and fetal movement were reviewed in detail with the patient. Please refer to After Visit Summary for other counseling recommendations.  Return in about 4 days (around 09/26/2016) for NST/ AFI for postdate.   Aviva Signs, CNM

## 2016-09-23 ENCOUNTER — Encounter (HOSPITAL_COMMUNITY): Payer: Self-pay | Admitting: *Deleted

## 2016-09-23 ENCOUNTER — Telehealth (HOSPITAL_COMMUNITY): Payer: Self-pay | Admitting: *Deleted

## 2016-09-23 NOTE — Telephone Encounter (Signed)
Preadmission screen  

## 2016-09-26 ENCOUNTER — Ambulatory Visit (INDEPENDENT_AMBULATORY_CARE_PROVIDER_SITE_OTHER): Payer: Medicaid Other | Admitting: Family Medicine

## 2016-09-26 ENCOUNTER — Ambulatory Visit: Payer: Self-pay

## 2016-09-26 VITALS — BP 109/76 | HR 71

## 2016-09-26 DIAGNOSIS — O480 Post-term pregnancy: Secondary | ICD-10-CM

## 2016-09-26 NOTE — Progress Notes (Signed)
NST reactive. AFI normal

## 2016-09-26 NOTE — Progress Notes (Signed)
IOL scheduled on 4/19.

## 2016-09-28 ENCOUNTER — Other Ambulatory Visit: Payer: Self-pay | Admitting: Advanced Practice Midwife

## 2016-09-29 ENCOUNTER — Encounter (HOSPITAL_COMMUNITY): Payer: Self-pay

## 2016-09-29 ENCOUNTER — Inpatient Hospital Stay (HOSPITAL_COMMUNITY): Payer: Medicaid Other | Admitting: Anesthesiology

## 2016-09-29 ENCOUNTER — Inpatient Hospital Stay (HOSPITAL_COMMUNITY)
Admission: RE | Admit: 2016-09-29 | Discharge: 2016-10-01 | DRG: 774 | Disposition: A | Discharge status: Home / Self Care | Payer: Medicaid Other | Source: Ambulatory Visit | Attending: Obstetrics and Gynecology | Admitting: Obstetrics and Gynecology

## 2016-09-29 ENCOUNTER — Encounter (HOSPITAL_COMMUNITY): Payer: Self-pay | Admitting: Anesthesiology

## 2016-09-29 DIAGNOSIS — O480 Post-term pregnancy: Principal | ICD-10-CM | POA: Diagnosis present

## 2016-09-29 DIAGNOSIS — F419 Anxiety disorder, unspecified: Secondary | ICD-10-CM | POA: Diagnosis present

## 2016-09-29 DIAGNOSIS — Z833 Family history of diabetes mellitus: Secondary | ICD-10-CM

## 2016-09-29 DIAGNOSIS — O4443 Low lying placenta NOS or without hemorrhage, third trimester: Secondary | ICD-10-CM | POA: Diagnosis present

## 2016-09-29 DIAGNOSIS — O34211 Maternal care for low transverse scar from previous cesarean delivery: Secondary | ICD-10-CM | POA: Diagnosis present

## 2016-09-29 DIAGNOSIS — O99344 Other mental disorders complicating childbirth: Secondary | ICD-10-CM | POA: Diagnosis present

## 2016-09-29 DIAGNOSIS — Z3A41 41 weeks gestation of pregnancy: Secondary | ICD-10-CM

## 2016-09-29 LAB — CBC
HEMATOCRIT: 36.2 % (ref 36.0–46.0)
Hemoglobin: 11.4 g/dL — ABNORMAL LOW (ref 12.0–15.0)
MCH: 24.6 pg — AB (ref 26.0–34.0)
MCHC: 31.5 g/dL (ref 30.0–36.0)
MCV: 78.2 fL (ref 78.0–100.0)
Platelets: 208 10*3/uL (ref 150–400)
RBC: 4.63 MIL/uL (ref 3.87–5.11)
RDW: 16.8 % — ABNORMAL HIGH (ref 11.5–15.5)
WBC: 11.5 10*3/uL — AB (ref 4.0–10.5)

## 2016-09-29 LAB — TYPE AND SCREEN
ABO/RH(D): A POS
ANTIBODY SCREEN: NEGATIVE

## 2016-09-29 LAB — RPR: RPR Ser Ql: NONREACTIVE

## 2016-09-29 MED ORDER — OXYCODONE-ACETAMINOPHEN 5-325 MG PO TABS: 1.0000 | ORAL_TABLET | ORAL | Status: DC | PRN

## 2016-09-29 MED ORDER — ACETAMINOPHEN 325 MG PO TABS: 650.0000 mg | ORAL_TABLET | ORAL | Status: DC | PRN

## 2016-09-29 MED ORDER — WITCH HAZEL-GLYCERIN EX PADS: 1.0000 "application " | MEDICATED_PAD | CUTANEOUS | Status: DC | PRN

## 2016-09-29 MED ORDER — BENZOCAINE-MENTHOL 20-0.5 % EX AERO: 1.0000 "application " | INHALATION_SPRAY | CUTANEOUS | Status: DC | PRN

## 2016-09-29 MED ORDER — LIDOCAINE HCL (PF) 1 % IJ SOLN
30.0000 mL | INTRAMUSCULAR | Status: DC | PRN
  Filled 2016-09-29: qty 30

## 2016-09-29 MED ORDER — DIPHENHYDRAMINE HCL 25 MG PO CAPS: 25.0000 mg | ORAL_CAPSULE | Freq: Four times a day (QID) | ORAL | Status: DC | PRN

## 2016-09-29 MED ORDER — PRENATAL MULTIVITAMIN CH
1.0000 | ORAL_TABLET | Freq: Every day | ORAL | Status: DC
  Administered 2016-09-30: 1 via ORAL
  Filled 2016-09-29: qty 1

## 2016-09-29 MED ORDER — FENTANYL CITRATE (PF) 100 MCG/2ML IJ SOLN: 100.0000 ug | INTRAMUSCULAR | Status: DC | PRN

## 2016-09-29 MED ORDER — ONDANSETRON HCL 4 MG/2ML IJ SOLN: 4.0000 mg | Freq: Four times a day (QID) | INTRAMUSCULAR | Status: DC | PRN

## 2016-09-29 MED ORDER — PHENYLEPHRINE 40 MCG/ML (10ML) SYRINGE FOR IV PUSH (FOR BLOOD PRESSURE SUPPORT)
80.0000 ug | PREFILLED_SYRINGE | INTRAVENOUS | Status: DC | PRN
Start: 2016-09-29 — End: 2016-09-29
  Filled 2016-09-29: qty 5

## 2016-09-29 MED ORDER — ONDANSETRON HCL 4 MG PO TABS: 4.0000 mg | ORAL_TABLET | ORAL | Status: DC | PRN

## 2016-09-29 MED ORDER — LACTATED RINGERS IV SOLN
500.0000 mL | INTRAVENOUS | Status: DC | PRN
  Administered 2016-09-29 (×2): 500 mL via INTRAVENOUS

## 2016-09-29 MED ORDER — DIBUCAINE 1 % RE OINT: 1.0000 "application " | TOPICAL_OINTMENT | RECTAL | Status: DC | PRN

## 2016-09-29 MED ORDER — EPHEDRINE 5 MG/ML INJ
10.0000 mg | INTRAVENOUS | Status: DC | PRN
  Filled 2016-09-29: qty 2

## 2016-09-29 MED ORDER — ZOLPIDEM TARTRATE 5 MG PO TABS: 5.0000 mg | ORAL_TABLET | Freq: Every evening | ORAL | Status: DC | PRN

## 2016-09-29 MED ORDER — ONDANSETRON HCL 4 MG/2ML IJ SOLN: 4.0000 mg | INTRAMUSCULAR | Status: DC | PRN

## 2016-09-29 MED ORDER — OXYTOCIN 40 UNITS IN LACTATED RINGERS INFUSION - SIMPLE MED: 2.5000 [IU]/h | INTRAVENOUS | Status: DC

## 2016-09-29 MED ORDER — SOD CITRATE-CITRIC ACID 500-334 MG/5ML PO SOLN: 30.0000 mL | ORAL | Status: DC | PRN

## 2016-09-29 MED ORDER — OXYTOCIN 40 UNITS IN LACTATED RINGERS INFUSION - SIMPLE MED
1.0000 m[IU]/min | INTRAVENOUS | Status: DC
  Administered 2016-09-29: 2 m[IU]/min via INTRAVENOUS
  Filled 2016-09-29: qty 1000

## 2016-09-29 MED ORDER — DIPHENHYDRAMINE HCL 50 MG/ML IJ SOLN: 12.5000 mg | INTRAMUSCULAR | Status: DC | PRN

## 2016-09-29 MED ORDER — PHENYLEPHRINE 40 MCG/ML (10ML) SYRINGE FOR IV PUSH (FOR BLOOD PRESSURE SUPPORT)
80.0000 ug | PREFILLED_SYRINGE | INTRAVENOUS | Status: DC | PRN
  Filled 2016-09-29: qty 5
  Filled 2016-09-29: qty 10

## 2016-09-29 MED ORDER — TERBUTALINE SULFATE 1 MG/ML IJ SOLN
0.2500 mg | Freq: Once | INTRAMUSCULAR | Status: DC | PRN
  Filled 2016-09-29: qty 1

## 2016-09-29 MED ORDER — FENTANYL 2.5 MCG/ML BUPIVACAINE 1/10 % EPIDURAL INFUSION (WH - ANES)
14.0000 mL/h | INTRAMUSCULAR | Status: DC | PRN
  Administered 2016-09-29 (×2): 14 mL/h via EPIDURAL
  Filled 2016-09-29 (×2): qty 100

## 2016-09-29 MED ORDER — LACTATED RINGERS IV SOLN: 500.0000 mL | Freq: Once | INTRAVENOUS | Status: DC

## 2016-09-29 MED ORDER — SENNOSIDES-DOCUSATE SODIUM 8.6-50 MG PO TABS
2.0000 | ORAL_TABLET | ORAL | Status: DC
  Administered 2016-09-30 (×2): 2 via ORAL
  Filled 2016-09-29 (×2): qty 2

## 2016-09-29 MED ORDER — ACETAMINOPHEN 325 MG PO TABS
650.0000 mg | ORAL_TABLET | ORAL | Status: DC | PRN
  Administered 2016-09-30: 650 mg via ORAL
  Filled 2016-09-29: qty 2

## 2016-09-29 MED ORDER — LIDOCAINE HCL (PF) 1 % IJ SOLN
INTRAMUSCULAR | Status: DC | PRN
  Administered 2016-09-29: 6 mL via EPIDURAL
  Administered 2016-09-29: 4 mL

## 2016-09-29 MED ORDER — OXYTOCIN BOLUS FROM INFUSION
500.0000 mL | Freq: Once | INTRAVENOUS | Status: AC
  Administered 2016-09-29: 500 mL via INTRAVENOUS

## 2016-09-29 MED ORDER — SIMETHICONE 80 MG PO CHEW
80.0000 mg | CHEWABLE_TABLET | ORAL | Status: DC | PRN
Start: 2016-09-29 — End: 2016-10-01

## 2016-09-29 MED ORDER — IBUPROFEN 600 MG PO TABS
600.0000 mg | ORAL_TABLET | Freq: Four times a day (QID) | ORAL | Status: DC
  Administered 2016-09-29 – 2016-10-01 (×7): 600 mg via ORAL
  Filled 2016-09-29 (×7): qty 1

## 2016-09-29 MED ORDER — OXYCODONE-ACETAMINOPHEN 5-325 MG PO TABS: 2.0000 | ORAL_TABLET | ORAL | Status: DC | PRN

## 2016-09-29 MED ORDER — COCONUT OIL OIL: 1.0000 "application " | TOPICAL_OIL | Status: DC | PRN

## 2016-09-29 MED ORDER — LACTATED RINGERS IV SOLN
INTRAVENOUS | Status: DC
  Administered 2016-09-29: 15:00:00 via INTRAVENOUS

## 2016-09-29 NOTE — Anesthesia Pain Management Evaluation Note (Signed)
  CRNA Pain Management Visit Note  Patient: Melanie Moore, 26 y.o., female  "Hello I am a member of the anesthesia team at [facility]. We have an anesthesia team available at all times to provide care throughout the hospital, including epidural management and anesthesia for C-section. I don't know your plan for the delivery whether it a natural birth, water birth, IV sedation, nitrous supplementation, doula or epidural, but we want to meet your pain goals."   1.Was your pain managed to your expectations on prior hospitalizations?   Yes   2.What is your expectation for pain management during this hospitalization?     Epidural  3.How can we help you reach that goal? Epidural intact and working well  Record the patient's initial score and the patient's pain goal.   Pain: 3  Pain Goal: 8 [facility] wants you to be able to say your pain was always managed very well.  Edison Pace 09/29/2016

## 2016-09-29 NOTE — Progress Notes (Signed)
Labor Progress Note  S: patient has good epidural. Very nervous about delivery. She is shivering  O:  BP 128/79   Pulse 87   Temp 97.9 F (36.6 C) (Oral)   Resp 20   Ht  (1.702 m)   Wt 81.6 kg (180 lb)   SpO2 100%   BMI 28.19 kg/m  EFM: 130, minimal variability, no accels, early decels [id number]: 7 Effacement (%): 90 Station: -1 Presentation: Vertex Exam by:: Valentina Lucks, RN   A&P: 26 y.o. [id number] [redacted]w[redacted]d IOL for postdates, TOLAC #Labor:pitocin #FWB: Category 2 tracing #GBS: Negative  Durenda Hurt, MD Resident Physician 2:49 PM

## 2016-09-29 NOTE — Progress Notes (Signed)
Fundus firm but deviated to the right. Patient was able to void and ambulated back to bed. Did get lightheaded and dizzy. Fundus still deviated to the right and no bleeding with massage. Bladder scan estimated 365 ml. Straight cath emptied 500 ml. Fundal check post cath was firm, midline and at UE.

## 2016-09-29 NOTE — Anesthesia Postprocedure Evaluation (Signed)
Anesthesia Post Note  Patient: Melanie Moore  Procedure(s) Performed: * No procedures listed *  Patient location during evaluation: Mother Baby Anesthesia Type: Epidural Level of consciousness: awake and alert Pain management: pain level controlled Vital Signs Assessment: post-procedure vital signs reviewed and stable Respiratory status: spontaneous breathing Cardiovascular status: blood pressure returned to baseline Postop Assessment: no headache, patient able to bend at knees, no backache, no signs of nausea or vomiting, epidural receding and adequate PO intake Anesthetic complications: no        Last Vitals:  Vitals:   09/29/16 1731 09/29/16 1824  BP: 122/76 111/71  Pulse: 95 83  Resp: 18 20  Temp:  36.8 C    Last Pain:  Vitals:   09/29/16 1824  TempSrc: Oral  PainSc: 0-No pain   Pain Goal:                 Salome Arnt

## 2016-09-29 NOTE — H&P (Signed)
OBSTETRIC ADMISSION HISTORY AND PHYSICAL  Melanie Moore is a 26 y.o. female [phone] with IUP at [redacted]w[redacted]d by 2nd trimester [place] presenting for IOL for postdates, TOLAC (2/2 cord prolapse). She reports +FMs, No LOF, no VB, no blurry vision, headaches or peripheral edema, and RUQ pain.  She plans on breast feeding. She request IUD for birth control.  Dating: By 2nd trimester [place] --->  Estimated Date of Delivery: 09/22/16  Prenatal History/Complications:  Past Medical History: Past Medical History:  Diagnosis Date  . 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 Vaginal Pap smear, abnormal     Past Surgical History: Past Surgical History:  Procedure Laterality Date  . CESAREAN SECTION N/A 03/18/2015   Procedure: CESAREAN SECTION;  Surgeon: Catalina Antigua, MD;  Location: [facility];  Service: Obstetrics;  Laterality: N/A;  . EYE SURGERY  2008   sty removed both eyes    Obstetrical History: OB History    Gravida Para Term Preterm AB Living   0 2   SAB TAB Ectopic Multiple Live Births   0 0 0 0 2      Social History: Social History   Social History  . Marital status: Single    Spouse name: N/A  . Number of children: N/A  . Years of education: N/A   Social History Main Topics  . Smoking status: Never Smoker  . Smokeless tobacco: Never Used  . Alcohol use No     Comment: socially before pregnancy  . Drug use: No  . Sexual activity: Not Currently    Birth control/ protection: None   Other Topics Concern  . None   Social History Narrative  . None    Family History: Family History  Problem Relation Age of Onset  . Diabetes Father   . Hyperlipidemia Father   . Depression Mother   . Fibromyalgia Mother   . Diabetes Maternal Grandfather   . Diabetes Paternal Grandmother   . Diabetes Paternal Grandfather     Allergies: No Known Allergies  Prescriptions Prior to Admission  Medication  Sig Dispense Refill Last Dose  . butalbital-acetaminophen-caffeine (FIORICET, ESGIC) 50-325-40 MG tablet Take 1-2 tablets by mouth every 6 (six) hours as needed for headache. (Patient not taking: Reported on 09/22/2016) 20 tablet 0 Not Taking  . Prenatal Vit-Fe Fumarate-FA (PRENATAL MULTIVITAMIN) TABS Take 1 tablet by mouth at bedtime.   Taking     Review of Systems   All systems reviewed and negative except as stated in HPI  Blood pressure (!) 125/91, pulse 85, temperature 98.1 F (36.7 C), temperature source Oral, resp. rate 20, height  (1.702 m), weight 81.6 kg (180 lb), currently breastfeeding. General appearance: alert, cooperative, appears stated age and no distress Lungs: clear to auscultation bilaterally Heart: regular rate and rhythm Abdomen: soft, non-tender; bowel sounds normal Extremities: Homans sign is negative, no sign of DVT Presentation: cephalic Fetal monitoringBaseline: 140 bpm, Variability: Good {> 6 bpm), Accelerations: Reactive and Decelerations: Absent Uterine activityFrequency: Every 2-4 minutes     Prenatal labs: ABO, Rh: A/POS/-- (02/06 0926) Antibody: NEG (02/06 0926) Rubella: Immune RPR: NON REAC (02/06 0926)  HBsAg: NEGATIVE (02/06 0926)  HIV: NONREACTIVE (02/06 0926)  GBS: Negative (03/22 0000)  2 hr Glucola : wnl Genetic screening  declined Anatomy US wnl except posterior low lying placenta  Prenatal Transfer Tool  Maternal Diabetes: No Genetic Screening: Declined Maternal  Ultrasounds/Referrals: Normal Fetal Ultrasounds or other Referrals:  Other: low lying placenta Maternal Substance Abuse:  No Significant Maternal Medications:  None Significant Maternal Lab Results: None  Results for orders placed or performed during the hospital encounter of 09/29/16 (from the past 24 hour(s))  CBC   Collection Time: 09/29/16  7:35 AM  Result Value Ref Range   WBC 11.5 (H) 4.0 - 10.5 K/uL   RBC 4.63 3.87 - 5.11 MIL/uL   Hemoglobin 11.4 (L) 12.0 -  15.0 g/dL   HCT 16.1 09.6 - 04.5 %   MCV 78.2 78.0 - 100.0 fL   MCH 24.6 (L) 26.0 - 34.0 pg   MCHC 31.5 30.0 - 36.0 g/dL   RDW 40.9 (H) 81.1 - 91.4 %   Platelets 208 150 - 400 K/uL    Patient Active Problem List   Diagnosis Date Noted  . Post term pregnancy at [redacted] weeks gestation 09/29/2016  . Supervision of low-risk pregnancy 06/07/2016  . Family history of polycystic kidney disease 06/07/2016  . Anxiety in pregnancy, antepartum, second trimester 06/07/2016  . Low lying placenta nos or without hemorrhage, second trimester 05/24/2016  . Previous cesarean delivery, delivered 03/18/2015    Assessment: Melanie Moore is a 26 y.o. G3P1102 at [redacted]w[redacted]d here for IOL for postdates, TOLAC (2/2 prolapsed cord)  #Labor: bishop score >8, start pitocin #Pain: Epidural upon request #FWB: Category 1 tracing #ID:  GBS negative #MOF: breast #MOC:IUD #Circ:  n/a, female  Melanie Hurt, MD 09/29/2016, 7:58 AM   OB FELLOW HISTORY AND PHYSICAL ATTESTATION  I confirm that I have verified the information documented in the resident's note and that I have also personally reperformed the physical exam and all medical decision making activities.      Melanie Moore 09/29/2016, 10:56 AM

## 2016-09-29 NOTE — Progress Notes (Signed)
Labor Progress Note  S: patient resting comfortably now that her epidural is in place. No complaints.  O:  BP 120/79   Pulse 83   Temp 97.6 F (36.4 C) (Oral)   Resp 18   Ht  (1.702 m)   Wt 81.6 kg (180 lb)   SpO2 100%   BMI 28.19 kg/m  EFM: 120, moderate variability, +accels, no decels [id number]: (P) 5 Effacement (%): (P) 70 Station: (P) -2 Presentation: (P) Vertex Exam by:: (P) Dr. Redmond Baseman   A&P: 26 y.o. [id number] [redacted]w[redacted]d IOL for postdates and is a TOLAC #Labor:pitocin @ 8, AROM now with small amount of clear fluid #FWB: Category 1 tracing #GBS: negative  Durenda Hurt, MD Resident Physician 11:30 AM

## 2016-09-29 NOTE — Anesthesia Preprocedure Evaluation (Signed)

## 2016-09-29 NOTE — Anesthesia Procedure Notes (Signed)
Epidural Patient location during procedure: OB  Staffing Anesthesiologist: Zacharie Portner  Preanesthetic Checklist Completed: patient identified, site marked, surgical consent, pre-op evaluation, timeout performed, IV checked, risks and benefits discussed and monitors and equipment checked  Epidural Patient position: sitting Prep: site prepped and draped and DuraPrep Patient monitoring: continuous pulse ox and blood pressure Approach: midline Location: L3-L4 Injection technique: LOR air  Needle:  Needle type: Tuohy  Needle gauge: 17 G Needle length: 9 cm and 9 Needle insertion depth: 5 cm cm Catheter type: closed end flexible Catheter size: 19 Gauge Catheter at skin depth: 10 cm Test dose: negative  Assessment Events: blood not aspirated, injection not painful, no injection resistance, negative IV test and no paresthesia  Additional Notes Dosing of Epidural:  1st dose, through catheter .............................................  Xylocaine 40 mg  2nd dose, through catheter, after waiting 3 minutes.........Xylocaine 60 mg    As each dose occurred, patient was free of IV sx; and patient exhibited no evidence of SA injection.  Patient is more comfortable after epidural dosed. Please see RN's note for documentation of vital signs,and FHR which are stable.  Patient reminded not to try to ambulate with numb legs, and that an RN must be present when she attempts to get up.        

## 2016-09-30 NOTE — Progress Notes (Signed)
Post Partum Day #1 Subjective: no complaints, up ad lib, voiding, tolerating PO and reports normal lochia  Objective: Blood pressure 114/65, pulse 100, temperature 97.9 F (36.6 C), temperature source Oral, resp. rate 18, height  (1.702 m), weight 81.6 kg (180 lb), SpO2 100 %, unknown if currently breastfeeding.  Physical Exam:  General: alert Lochia: appropriate Uterine Fundus: firm and NT at U-1 DVT Evaluation: No evidence of DVT seen on physical exam.   Recent Labs  09/29/16 0735  HGB 11.4*  HCT 36.2    Assessment/Plan: Plan for discharge tomorrow  Plans IUD at [facility]   LOS: 1 day   Nyeisha Goodall C Azarian Starace 09/30/2016, 6:26 AM

## 2016-09-30 NOTE — Progress Notes (Signed)
MOB was referred for history of depression/anxiety. * Referral screened out by Clinical Social Worker because none of the following criteria appear to apply: ~ History of anxiety/depression during this pregnancy, or of post-partum depression. ~ Diagnosis of anxiety and/or depression within last 3 years OR * MOB's symptoms currently being treated with medication and/or therapy. Please contact the Clinical Social Worker if needs arise, or if MOB requests.  Upon chart review, CSW does not note a dx of Anx/Dep in MOB's hx, but rather initially feeling "scared about pregnancy" (hx of emergent c/section due to cord prolapse) and having difficulty accepting that she became pregnant while on birth control.  MOB saw [facility] in OB office and it is not that she has come to terms with the pregnancy and possibility of having another c/section.    

## 2016-09-30 NOTE — Lactation Note (Signed)
This note was copied from a baby's chart. Lactation Consultation Note Worked with experienced mother on positioning and recognizing swallows.  Used breast compression to aid in transfer. Mother states she knows how to hand express. Encouraged feeding on cue.  Information given on support groups and outpatient services.  Patient Name: Girl Kerria Sapien [id number] Date: 09/30/2016 Reason for consult: Initial assessment   Maternal Data Has patient been taught Hand Expression?: Yes  Feeding Feeding Type: Breast Fed Length of feed: 12 min  LATCH Score/Interventions Latch: Repeated attempts needed to sustain latch, nipple held in mouth throughout feeding, stimulation needed to elicit sucking reflex.  Audible Swallowing: A few with stimulation  Type of Nipple: Everted at rest and after stimulation  Comfort (Breast/Nipple): Soft / non-tender     Hold (Positioning): Assistance needed to correctly position infant at breast and maintain latch.  LATCH Score: 7  Lactation Tools Discussed/Used     Consult Status Consult Status: Follow-up Date: 10/01/16 Follow-up type: In-patient    Soyla Dryer 09/30/2016, 2:12 PM

## 2016-10-01 MED ORDER — IBUPROFEN 600 MG PO TABS: 600.0000 mg | ORAL_TABLET | Freq: Four times a day (QID) | ORAL | 0 refills | Status: AC | PRN

## 2016-10-01 NOTE — Lactation Note (Signed)
This note was copied from a baby's chart. Lactation Consultation Note  Patient Name: Melanie Moore [id number] Date: 10/01/2016  Mom states she feels baby is nursing well and she is hearing swallows. c/o of initial latch in pain. She states feedings become more comfortable .  Reviewed importance of waiting for wide open mouth and bringing baby quickly to breast.  Recommended using expressed milk/coconut oil to nipples.  Reviewed lactation outpatient services and encouraged to call prn.    Maternal Data    Feeding    LATCH Score/Interventions                      Lactation Tools Discussed/Used     Consult Status      Huston Foley 10/01/2016, 9:00 AM

## 2016-10-01 NOTE — Discharge Instructions (Signed)

## 2016-10-01 NOTE — Discharge Summary (Signed)
OB Discharge Summary     Patient Name: Melanie Moore DOB: [birth year] MRN: [id number]  Date of admission: 09/29/2016 Delivering MD: Maryjo Rochester L   Date of discharge: 10/01/2016  Admitting diagnosis: INDUCTION Intrauterine pregnancy: [redacted]w[redacted]d     Secondary diagnosis:  Active Problems:   Post term pregnancy at [redacted] weeks gestation  Additional problems: prev C/S (due to cord prolapse)     Discharge diagnosis: Term Pregnancy Delivered and VBAC                                                                                                Post partum procedures:none  Augmentation: AROM and Pitocin  Complications: None  Hospital course:  Induction of Labor With Vaginal Delivery   26 y.o. yo [id number] at [redacted]w[redacted]d was admitted to the hospital 09/29/2016 for induction of labor.  Indication for induction: Postdates.  Patient had an uncomplicated labor course as follows: Membrane Rupture Time/Date: 11:26 AM ,09/29/2016   Intrapartum Procedures: Episiotomy: None [1]                                         Lacerations:  Periurethral [8]  Patient had delivery of a Viable infant.  Information for the patient's newborn:  Chanele, Douglas [[id number]]  Delivery Method: VBAC, Spontaneous (Filed from Delivery Summary)   09/29/2016  Details of delivery can be found in separate delivery note.  Patient had a routine postpartum course. Patient is discharged home 10/01/16.  Physical exam  Vitals:   09/29/16 1957 09/30/16 0040 09/30/16 1707 10/01/16 0604  BP: 110/64 114/65 124/71 (!) 102/55  Pulse: 83 100 94 83  Resp: Temp: 98 F (36.7 C) 97.9 F (36.6 C) 98 F (36.7 C) 98 F (36.7 C)  TempSrc: Oral Oral Oral Oral  SpO2:      Weight:      Height:       General: alert and cooperative Lochia: appropriate Uterine Fundus: firm Incision: N/A DVT Evaluation: No evidence of DVT seen on physical exam. Labs: Lab Results  Component Value Date   WBC 11.5 (H) 09/29/2016   HGB 11.4 (L)  09/29/2016   HCT 36.2 09/29/2016   MCV 78.2 09/29/2016   PLT 208 09/29/2016   CMP Latest Ref Rng & Units 08/09/2016  Glucose 65 - 99 mg/dL 68  BUN 6 - 20 mg/dL 11  Creatinine 1.30 - 8.65 mg/dL 7.84  Sodium 696 - 295 mmol/L 135  Potassium 3.5 - 5.1 mmol/L 3.9  Chloride 101 - 111 mmol/L 107  CO2 22 - 32 mmol/L 22  Calcium 8.9 - 10.3 mg/dL 9.4  Total Protein 6.5 - 8.1 g/dL 6.7  Total Bilirubin 0.3 - 1.2 mg/dL 0.4  Alkaline Phos 38 - 126 U/L 105  AST 15 - 41 U/L 17  ALT 14 - 54 U/L 10(L)    Discharge instruction: per After Visit Summary and "Baby and Me Booklet".  After visit meds:  Allergies as of 10/01/2016   No  Known Allergies     Medication List    STOP taking these medications   butalbital-acetaminophen-caffeine 50-325-40 MG tablet Commonly known as:  FIORICET, ESGIC     TAKE these medications   ibuprofen 600 MG tablet Commonly known as:  ADVIL,MOTRIN Take 1 tablet (600 mg total) by mouth every 6 (six) hours as needed.   prenatal multivitamin Tabs tablet Take 1 tablet by mouth at bedtime.       Diet: routine diet  Activity: Advance as tolerated. Pelvic rest for 6 weeks.   Outpatient follow up:6 weeks Follow up Appt:No future appointments. Follow up Visit:No Follow-up on file.  Postpartum contraception: IUD Mirena  Newborn Data: Live born female  Birth Weight: 8 lb 9.6 oz (3900 g) APGAR: 9, 9  Baby Feeding: Breast Disposition:home with mother   10/01/2016 Cam Hai, CNM  7:42 AM

## 2016-10-04 ENCOUNTER — Encounter: Payer: Self-pay | Admitting: General Practice

## 2016-10-04 NOTE — Anesthesia Postprocedure Evaluation (Signed)
Anesthesia Post Note  Patient: Melanie Moore  Procedure(s) Performed: * No procedures listed *  Patient location during evaluation: Mother Baby Anesthesia Type: Epidural Level of consciousness: awake and alert Pain management: pain level controlled Vital Signs Assessment: post-procedure vital signs reviewed and stable Respiratory status: spontaneous breathing, nonlabored ventilation and respiratory function stable Cardiovascular status: stable Postop Assessment: no headache, no backache and epidural receding Anesthetic complications: no       Last Vitals: There were no vitals filed for this visit.  Last Pain: There were no vitals filed for this visit.               Jiles Garter

## 2016-11-10 ENCOUNTER — Encounter: Payer: Self-pay | Admitting: Obstetrics and Gynecology

## 2016-11-10 ENCOUNTER — Ambulatory Visit (INDEPENDENT_AMBULATORY_CARE_PROVIDER_SITE_OTHER): Payer: Managed Care, Other (non HMO) | Admitting: Obstetrics and Gynecology

## 2016-11-10 DIAGNOSIS — Z30430 Encounter for insertion of intrauterine contraceptive device: Secondary | ICD-10-CM | POA: Diagnosis not present

## 2016-11-10 LAB — POCT PREGNANCY, URINE: PREG TEST UR: NEGATIVE

## 2016-11-10 MED ORDER — LEVONORGESTREL 20 MCG/24HR IU IUD
INTRAUTERINE_SYSTEM | Freq: Once | INTRAUTERINE | Status: AC
  Administered 2016-11-10: 12:00:00 via INTRAUTERINE

## 2016-11-10 NOTE — Progress Notes (Signed)
Subjective:     Melanie LloydMilka Moore is a 26 y.o. female who presents for a postpartum visit. She is 6 weeks postpartum following a spontaneous vaginal delivery. I have fully reviewed the prenatal and intrapartum course. The delivery was at 41 gestational weeks. Outcome: spontaneous vaginal delivery. Anesthesia: epidural. Postpartum course has been uncomplicated. Baby's course has been uncomplicated. Baby is feeding by bottle Similac soy . Bleeding no bleeding. Bowel function is normal. Bladder function is normal. Patient is not sexually active. Contraception method is none. Postpartum depression screening: negative.  The following portions of the patient's history were reviewed and updated as appropriate: allergies, current medications, past family history, past medical history, past social history, past surgical history and problem list.  Review of Systems Pertinent items are noted in HPI.   Objective:    There were no vitals taken for this visit.  General:  alert, cooperative and no distress   Breasts:  inspection negative, no nipple discharge or bleeding, no masses or nodularity palpable  Lungs: clear to auscultation bilaterally  Heart:  regular rate and rhythm, S1, S2 normal, no murmur, click, rub or gallop  Abdomen: soft, non-tender; bowel sounds normal; no masses,  no organomegaly   Vulva:  normal  Vagina: normal vagina  Cervix:  no cervical motion tenderness and no lesions  Corpus: normal size, contour, position, consistency, mobility, non-tender  Adnexa:  no mass, fullness, tenderness  Rectal Exam: Not performed.        Assessment:     6 wk postpartum exam. Pap smear not done at today's visit.   Plan:    1. Contraception: IUD 3. Follow up in: 2 months for string check or as needed.    IUD Insertion Procedure Note  Patient identified, informed consent performed.  Discussed risks of irregular bleeding, cramping, infection, malpositioning or misplacement of the IUD outside the  uterus which may require further procedures. Time out was performed.  Urine pregnancy test negative.  Speculum placed in the vagina.  Cervix visualized.  Cleaned with Betadine x 2.  Grasped anteriorly with a single tooth tenaculum.  Uterus sounded to 9 cm.  Mirena IUD placed per manufacturer's recommendations.  Strings trimmed to 3 cm. Tenaculum was removed, Bleeding at site on right resolved after application silver nitrate x1; then good hemostasis noted. Patient tolerated procedure well.   Patient was given post-procedure instructions

## 2016-11-10 NOTE — Progress Notes (Signed)
See below

## 2016-11-10 NOTE — Patient Instructions (Signed)

## 2017-02-23 IMAGING — US US OB FOLLOW-UP
1 series · 12 of 28 positions shown · non-contrast
Comparison: none

[Series 1: us ob follow-up · 0.21mm/px · 52 acquisitions, 12 frames shown]
[im 2/52]
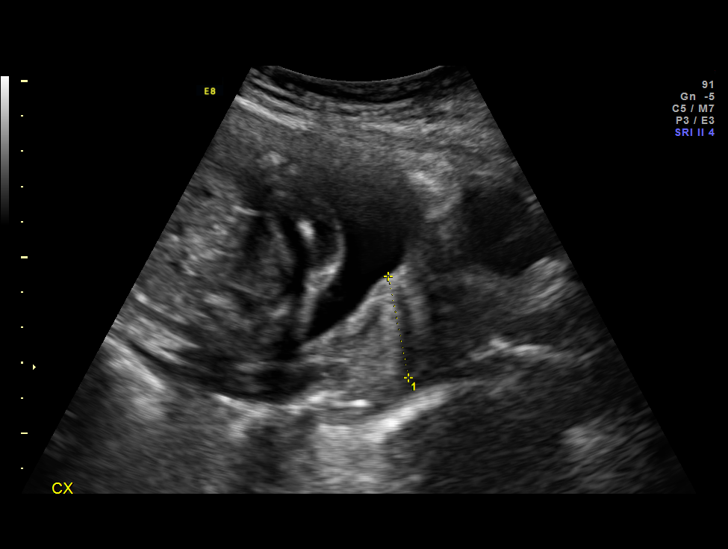
[im 6/52]
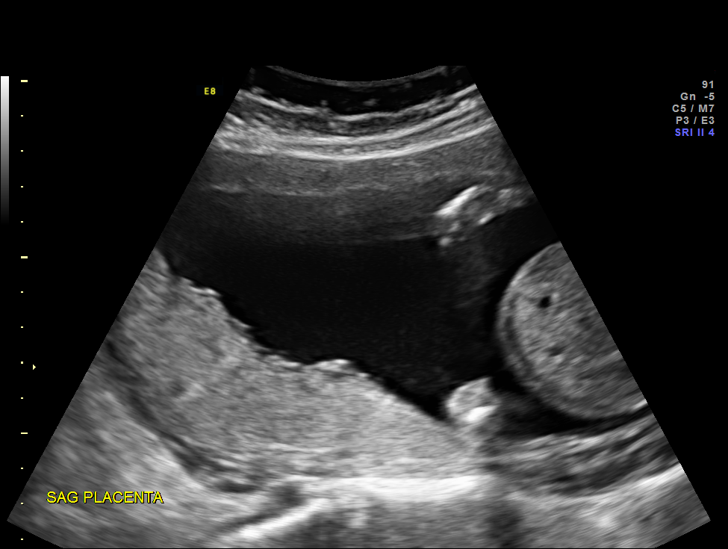
[im 10/52]
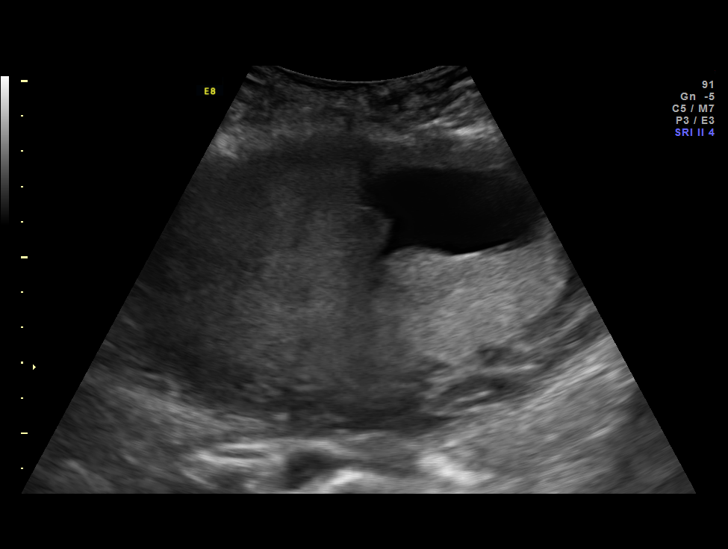
[im 16/52]
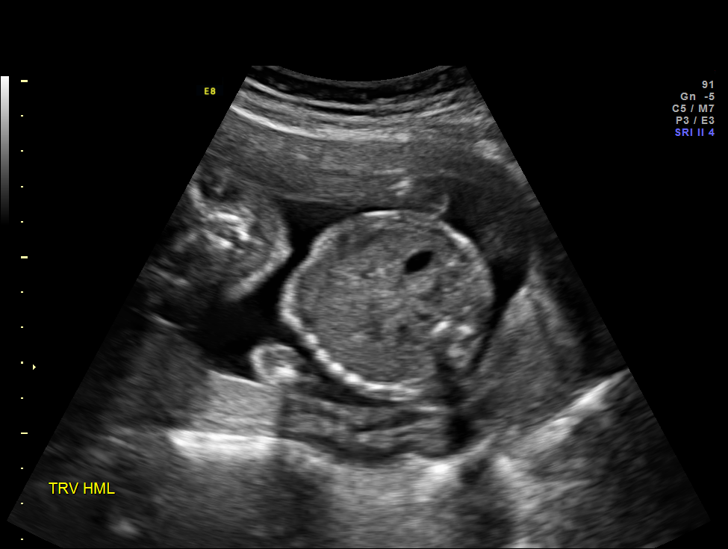
[im 19/52]
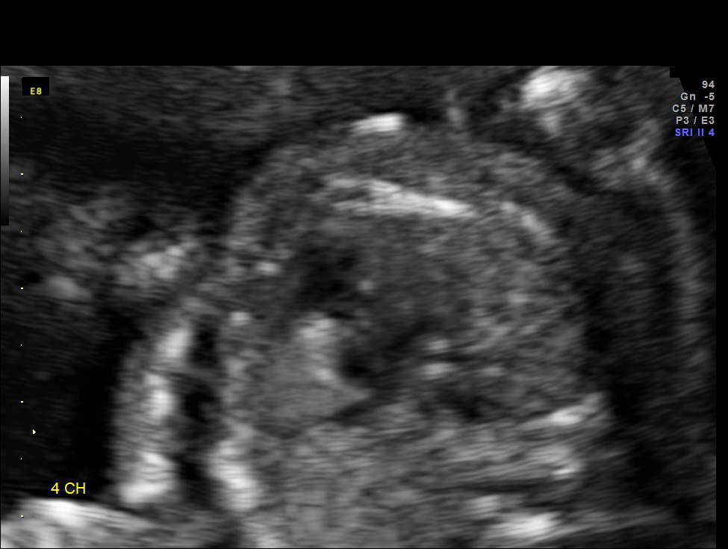
[im 23/52]
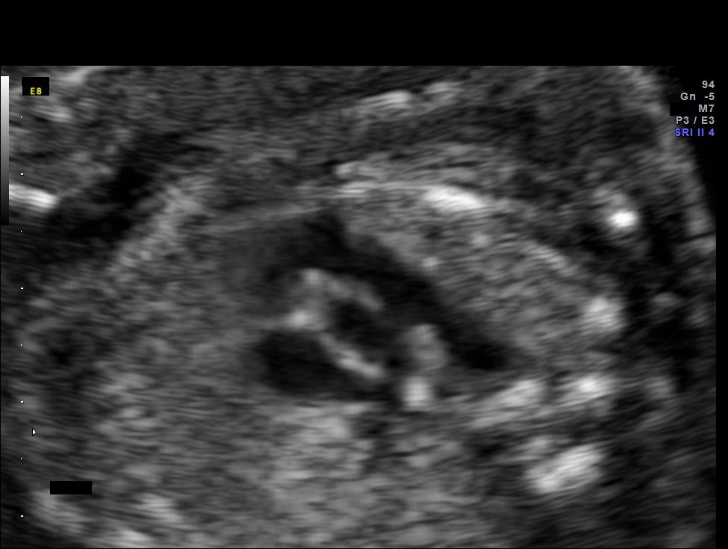
[im 29/52]
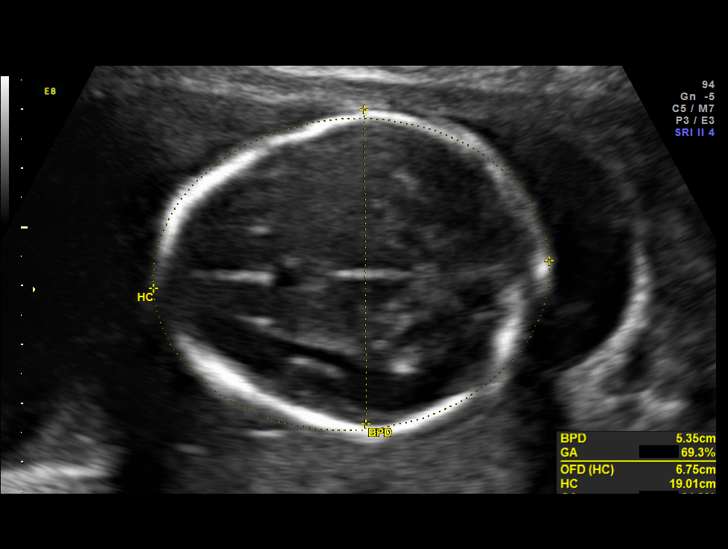
[im 33/52]
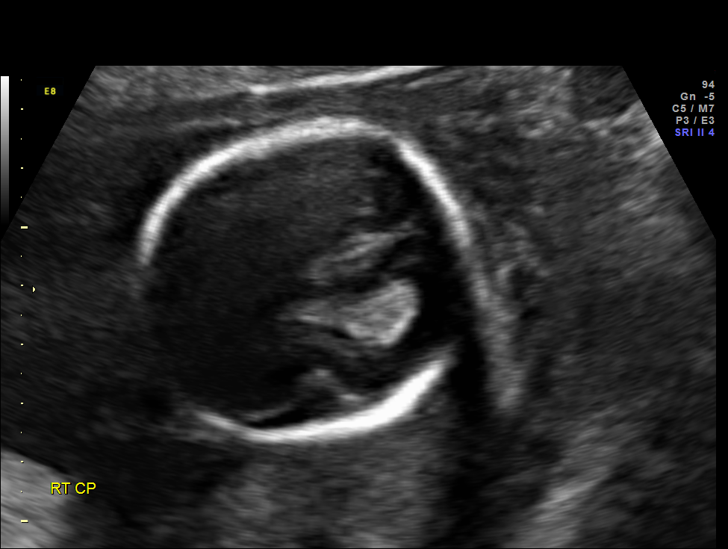
[im 36/52]
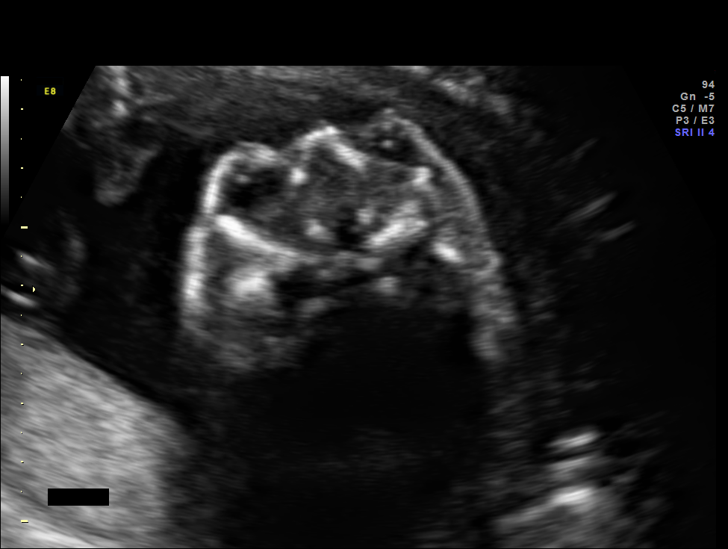
[im 42/52]
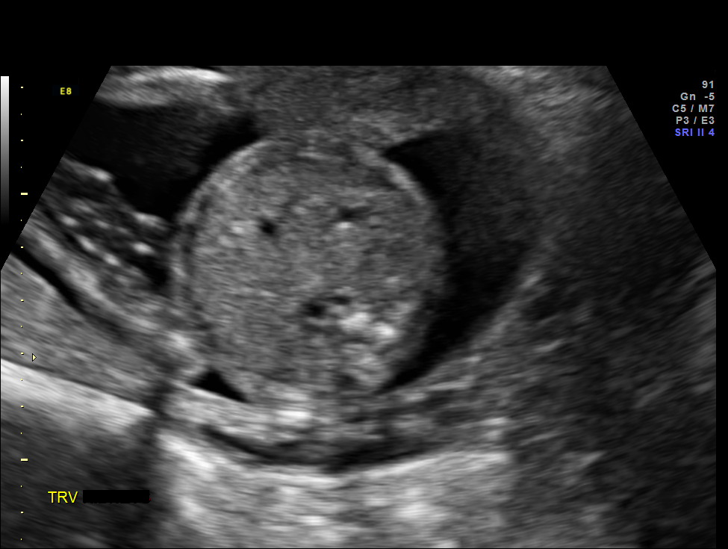
[im 46/52]
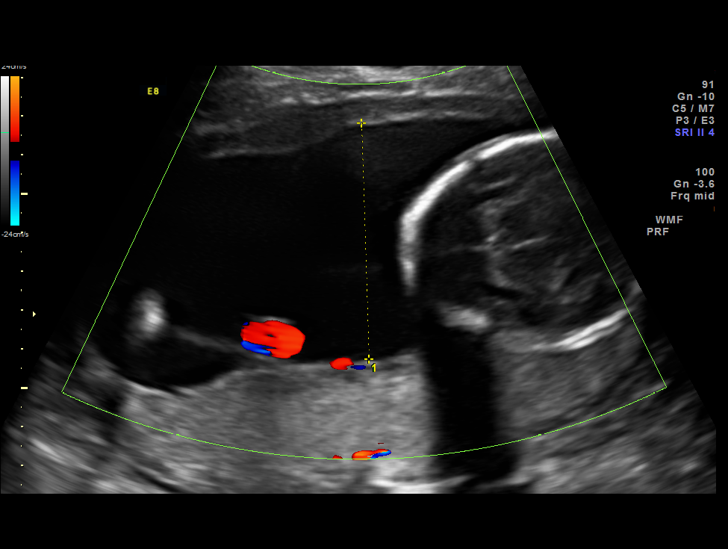
[im 50/52]
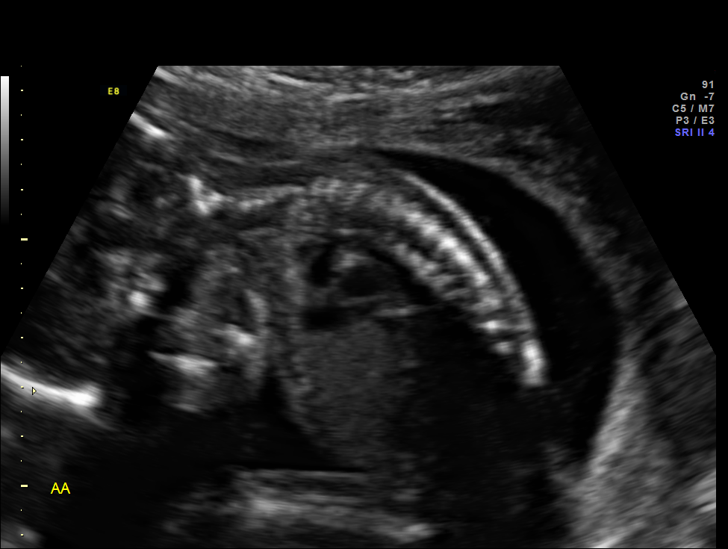

[12 of 28 positions shown; findings below may reference images not displayed]

OBSTETRICS REPORT
(Signed Final 11/20/2014 [DATE])

Name:       RAISUL ISLAM LX                         Visit  11/20/2014 [DATE]
Date:

Service(s) Provided

US OB FOLLOW UP                                        76816.1
Indications

21 weeks gestation of pregnancy
History of genetic / anatomic abnormality -
previous child with unilateral renal agenesis and
microdeletion at 0q73.6
Fetal Evaluation

Num Of             1
Fetuses:
Fetal Heart        139                          bpm
Rate:
Cardiac Activity:  Observed
Presentation:      Transverse, head to
maternal left
Placenta:          Posterior, above cervical
os
P. Cord            Visualized, central
Insertion:

Amniotic Fluid
AFI FV:      Subjectively within normal limits
Larg Pckt:      6.1  cm
Biometry

BPD:     53.7   m    G. Age:   22w 2d                 CI:         80.5   70 - 86
m
OFD:     66.7   m                                     FL/HC:      18.2   18.4 -
m
HC:     188.7   m    G. Age:   21w 1d        19  %    HC/AC:      1.09   1.06 -
m
AC:     173.8   m    G. Age:   22w 2d        62  %    FL/BPD      64.1   71 - 87
m                                     :
FL:      34.4   m    G. Age:   20w 6d        16  %    FL/AC:      19.8   20 - 24
m
HUM:     34.1   m    G. Age:   21w 4d        46  %
m
Est.         439   gm   0 lb 15 oz      43   %
FW:
Gestational Age

LMP:           20w 1d        Date:  07/02/14                  EDD:   04/08/15
U/S Today:     21w 5d                                         EDD:   03/28/15
Best:          21w 5d    Det. By:   Early Ultrasound          EDD:   03/28/15
(09/05/14)
Anatomy

Cranium:          Appears normal         Aortic Arch:       Appears normal
Fetal Cavum:      Previously seen        Ductal Arch:       Previously seen
Ventricles:       Appears normal         Diaphragm:         Previously seen
Choroid Plexus:   Resolved CPC           Stomach:           Appears normal,
left sided
Cerebellum:       Previously seen        Abdomen:           Previously seen
Posterior         Previously seen        Abdominal          Previously seen
Fossa:                                   Wall:
Nuchal Fold:      Previously seen        Cord Vessels:      Previously seen
Face:             Orbits and profile     Kidneys:           Appear normal
previously seen
Lips:             Previously seen        Bladder:           Appears normal
Palate:           Previously seen        Spine:             Previously seen
Heart:            Appears normal         Lower              Previously seen
(4CH, axis, and        Extremities:
situs)
RVOT:             Appears normal         Upper              Previously seen
Extremities:
LVOT:             Appears normal

Other:   Heels and 5th digit appear normal previously. Fetus appears to be
a female.
Cervix Uterus Adnexa

Cervical Length:    2.7       cm

Cervix:       Normal appearance by transabdominal scan.
Uterus:       No abnormality visualized.
Cul De Sac:   No free fluid seen.

Left Ovary:    Not visualized.
Right Ovary:   Not visualized.

Adnexa:     No abnormality visualized.
Impression

Singleton intrauterine pregnancy at 21 weeks 5 days
gestation with fetal cardiac activity
Transverse presentation
Completion of fetal anatomic survey
Normal appearing fetal growth and anatomic survey
Posterior placenta without evidence of previa
Recommendations

Follow-up ultrasounds as clinically indicated.

## 2017-04-25 IMAGING — US US OB LIMITED
1 series · 13 of 23 positions shown · non-contrast
Comparison: none

[Series 1: us ob follow up · 13 of 23 slices shown]
[im 1/23]
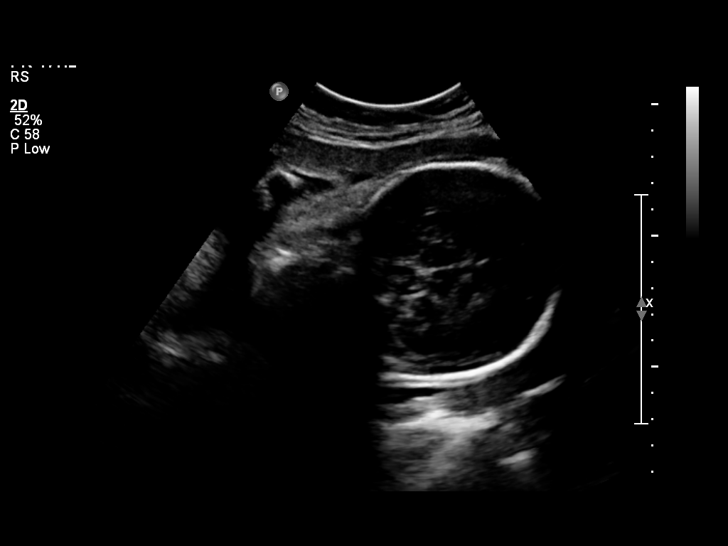
[im 3/23]
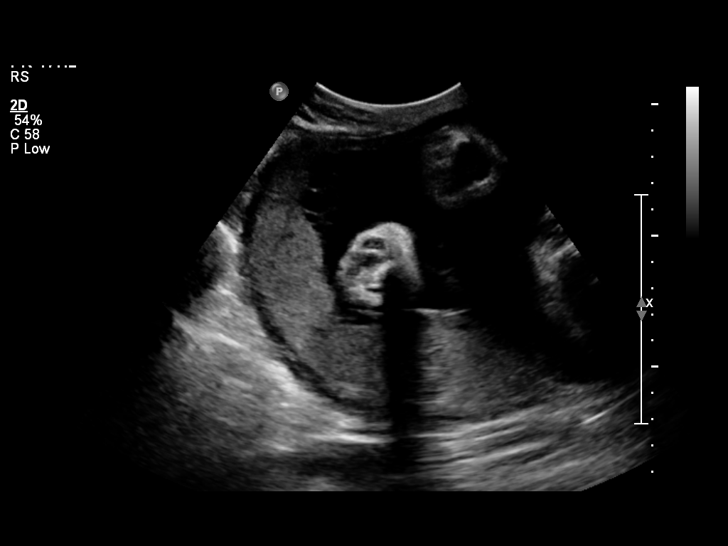
[im 5/23]
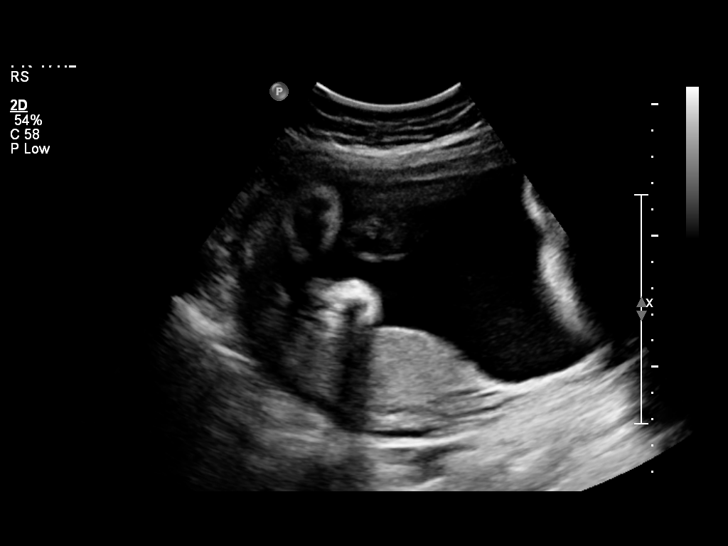
[im 7/23]
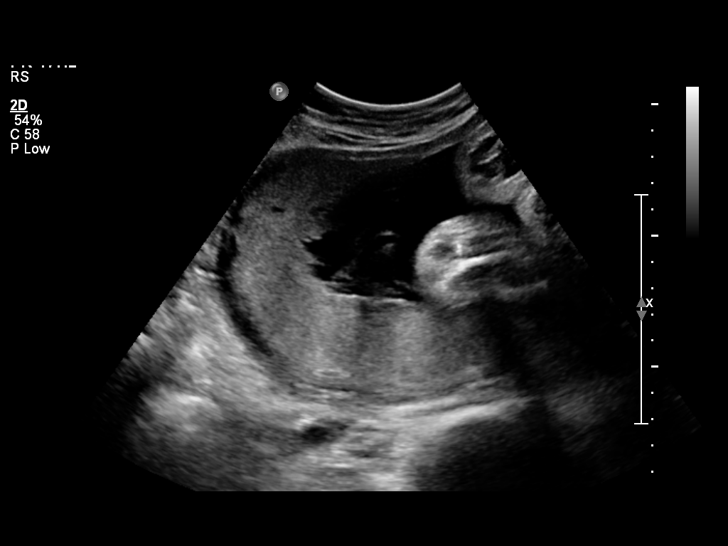
[im 8/23]
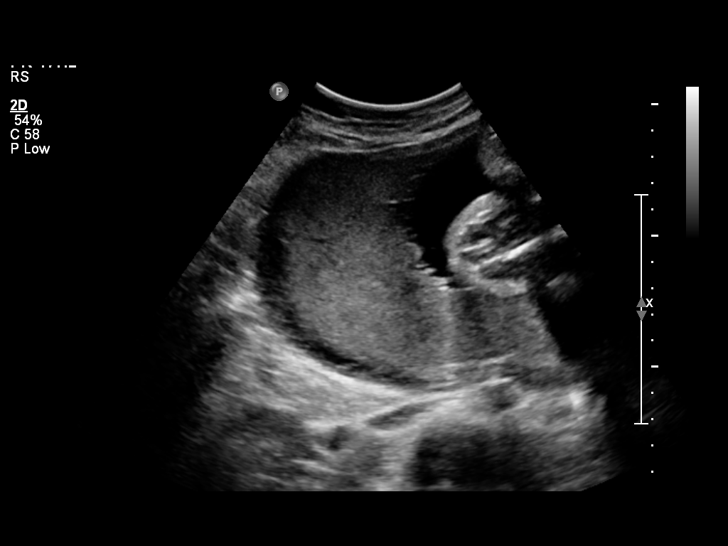
[im 10/23]
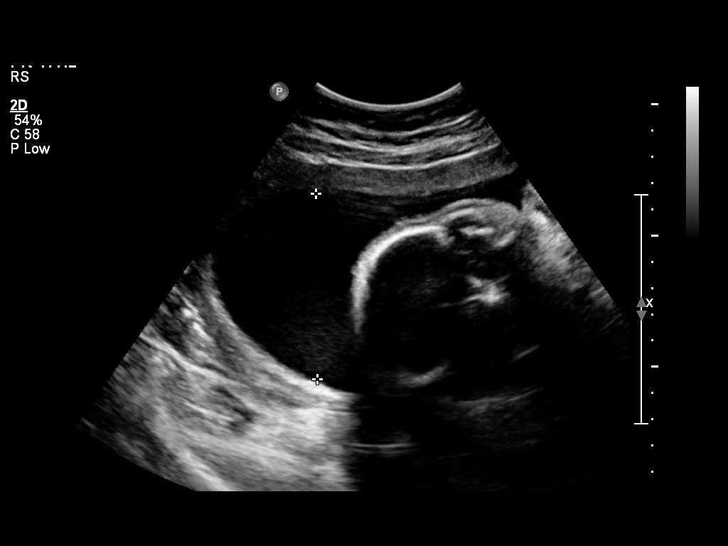
[im 12/23]
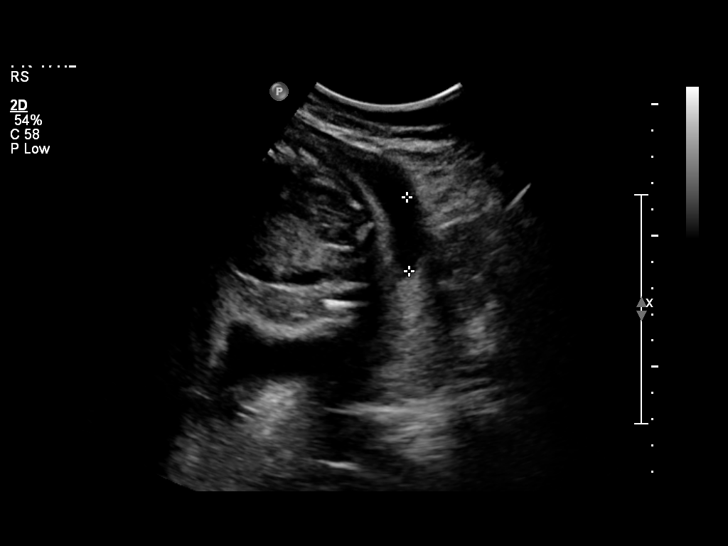
[im 14/23]
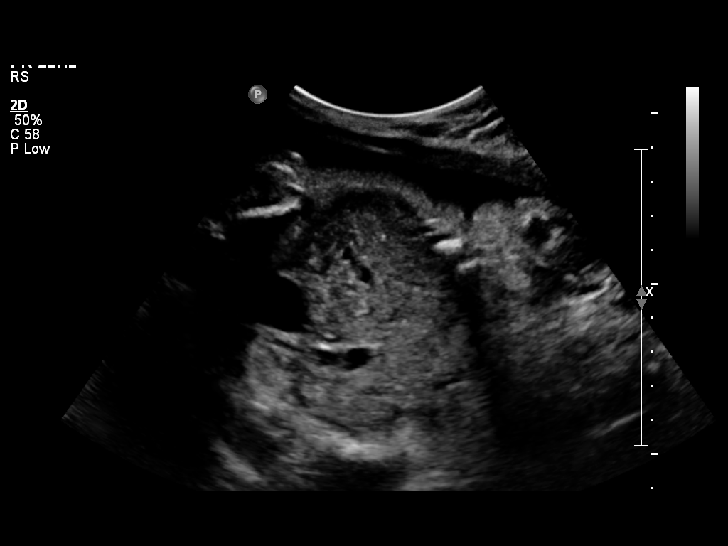
[im 16/23]
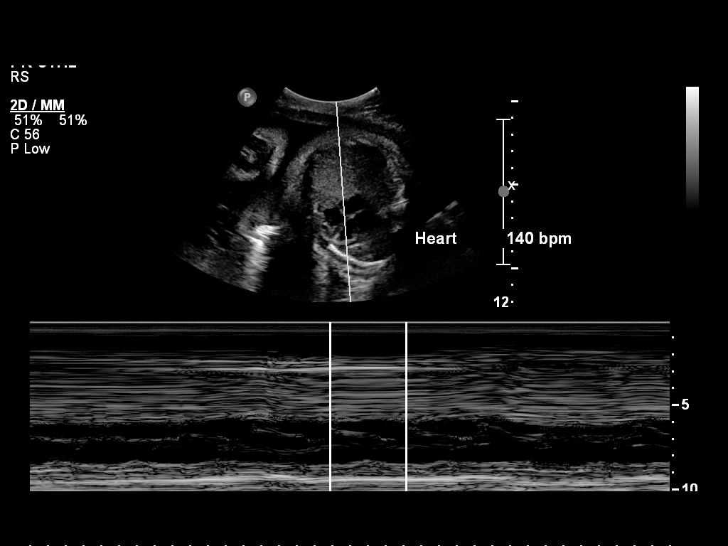
[im 17/23]
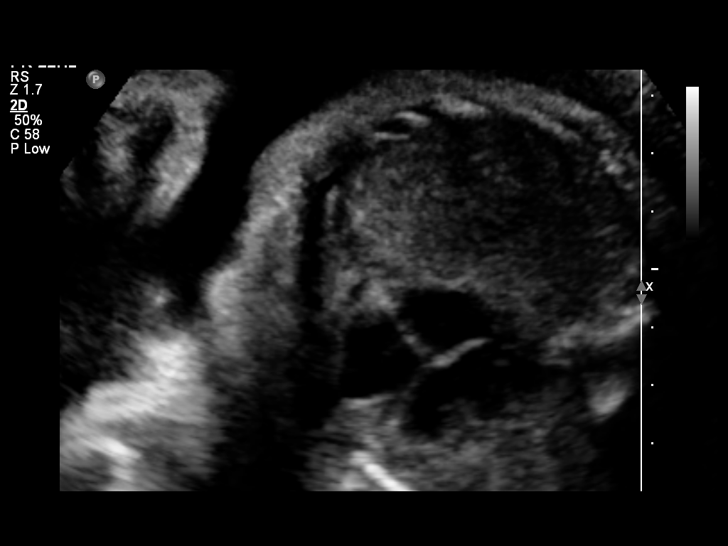
[im 19/23]
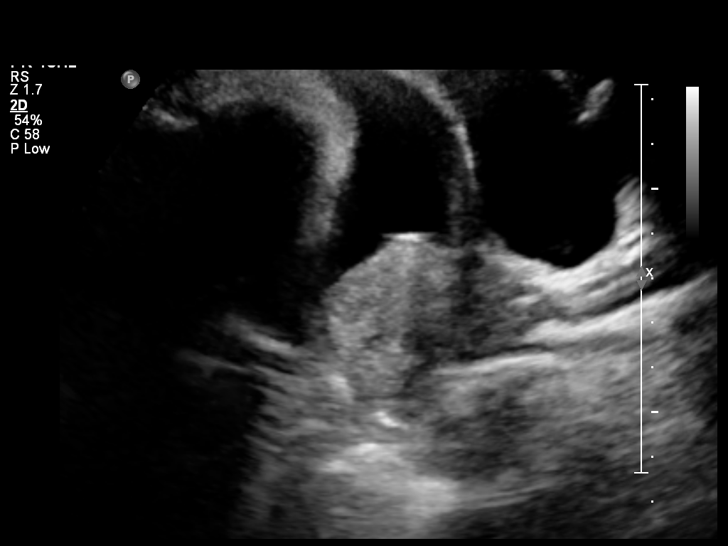
[im 21/23]
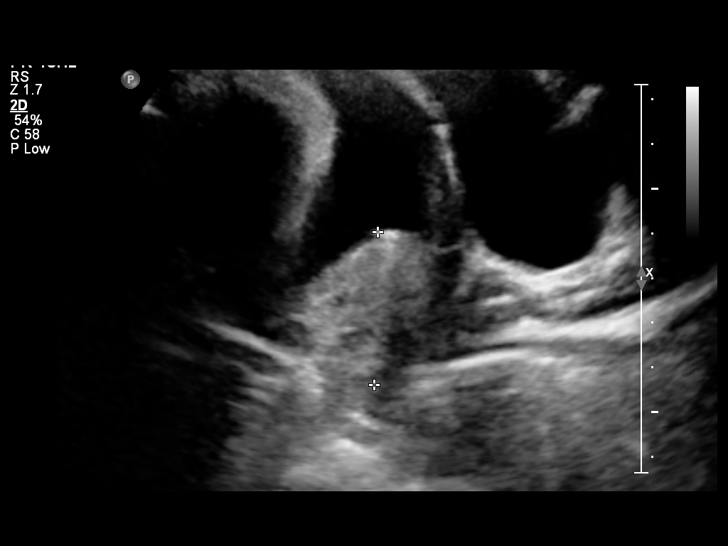
[im 23/23]
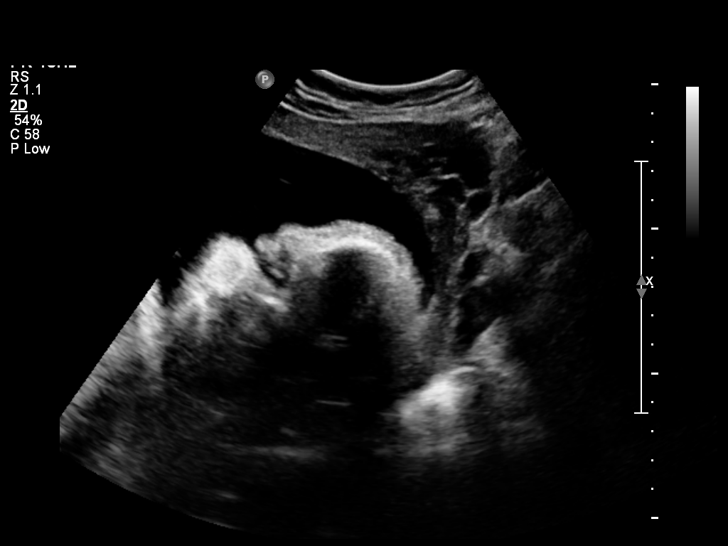

[13 of 23 positions shown; findings below may reference images not displayed]

OBSTETRICS REPORT
(Signed Final 01/20/2015 [DATE])

Name:       SHARHONDA BHATTACHARYA                         Visit  01/20/2015 [DATE]
Date:

Service(s) Provided

[HOSPITAL]                                          76815.0
Indications

History of genetic / anatomic abnormality -
previous child with unilateral renal agenesis and
microdeletion at 6q38.0
30 weeks gestation of pregnancy
Vaginal bleeding, unknown etiology
Fetal Evaluation

Num Of             1
Fetuses:
Fetal Heart        140                          bpm
Rate:
Cardiac Activity:  Observed
Presentation:      Cephalic
Placenta:          Posterior, above cervical
os
P. Cord            Previously Visualized
Insertion:

Comment     No placental abruption or previa identified.
:

Amniotic Fluid
AFI FV:      Subjectively within normal limits
AFI Sum:     18.29    cm      69  %Tile     Larg Pckt:    7.04   cm
RUQ:   2.81    cm    RLQ:   7.04    cm   LUQ:    3.97    cm   LLQ:    4.47   cm
Gestational Age

LMP:           28w 6d        Date:  07/02/14                  EDD:   04/08/15
Best:          30w 3d    Det. By:   Early Ultrasound          EDD:   03/28/15
(09/05/14)
Cervix Uterus Adnexa
Cervix:       Not visualized (advanced GA >56wks)
Impression

SIUP at 35w3d (remote read only)
active singleton fetus in cephalic presentation
AFI is gestational age appropriate
no previa or clear sonographic evidence of abruption
Recommendations

Follow up as clinically indicated.

## 2017-10-17 ENCOUNTER — Encounter: Payer: Self-pay | Admitting: *Deleted

## 2020-02-13 ENCOUNTER — Encounter (HOSPITAL_COMMUNITY): Payer: Self-pay

## 2020-02-13 ENCOUNTER — Emergency Department (HOSPITAL_COMMUNITY): Payer: HRSA Program

## 2020-02-13 ENCOUNTER — Other Ambulatory Visit: Payer: Self-pay

## 2020-02-13 ENCOUNTER — Emergency Department (HOSPITAL_COMMUNITY)
Admission: EM | Admit: 2020-02-13 | Discharge: 2020-02-13 | Disposition: A | Discharge status: Home / Self Care | Payer: HRSA Program | Attending: Emergency Medicine | Admitting: Emergency Medicine

## 2020-02-13 DIAGNOSIS — R197 Diarrhea, unspecified: Secondary | ICD-10-CM | POA: Diagnosis not present

## 2020-02-13 DIAGNOSIS — U071 COVID-19: Secondary | ICD-10-CM | POA: Diagnosis not present

## 2020-02-13 DIAGNOSIS — R0602 Shortness of breath: Secondary | ICD-10-CM | POA: Diagnosis present

## 2020-02-13 LAB — CBC WITH DIFFERENTIAL/PLATELET
Abs Immature Granulocytes: 0.03 10*3/uL (ref 0.00–0.07)
Basophils Absolute: 0 10*3/uL (ref 0.0–0.1)
Basophils Relative: 0 %
Eosinophils Absolute: 0 10*3/uL (ref 0.0–0.5)
Eosinophils Relative: 0 %
HCT: 38.9 % (ref 36.0–46.0)
Hemoglobin: 13 g/dL (ref 12.0–15.0)
Immature Granulocytes: 1 %
Lymphocytes Relative: 15 %
Lymphs Abs: 0.8 10*3/uL (ref 0.7–4.0)
MCH: 29 pg (ref 26.0–34.0)
MCHC: 33.4 g/dL (ref 30.0–36.0)
MCV: 86.6 fL (ref 80.0–100.0)
Monocytes Absolute: 0.5 10*3/uL (ref 0.1–1.0)
Monocytes Relative: 9 %
Neutro Abs: 3.9 10*3/uL (ref 1.7–7.7)
Neutrophils Relative %: 75 %
Platelets: 170 10*3/uL (ref 150–400)
RBC: 4.49 MIL/uL (ref 3.87–5.11)
RDW: 13.3 % (ref 11.5–15.5)
WBC: 5.1 10*3/uL (ref 4.0–10.5)
nRBC: 0 % (ref 0.0–0.2)

## 2020-02-13 LAB — COMPREHENSIVE METABOLIC PANEL
ALT: 181 U/L — ABNORMAL HIGH (ref 0–44)
AST: 123 U/L — ABNORMAL HIGH (ref 15–41)
Albumin: 3.8 g/dL (ref 3.5–5.0)
Alkaline Phosphatase: 48 U/L (ref 38–126)
Anion gap: 11 (ref 5–15)
BUN: 9 mg/dL (ref 6–20)
CO2: 26 mmol/L (ref 22–32)
Calcium: 8.9 mg/dL (ref 8.9–10.3)
Chloride: 102 mmol/L (ref 98–111)
Creatinine, Ser: 0.69 mg/dL (ref 0.44–1.00)
GFR calc Af Amer: >60 mL/min (ref >60)
GFR calc non Af Amer: >60 mL/min (ref >60)
Glucose, Bld: 104 mg/dL — ABNORMAL HIGH (ref 70–99)
Potassium: 3.8 mmol/L (ref 3.5–5.1)
Sodium: 139 mmol/L (ref 135–145)
Total Bilirubin: 0.6 mg/dL (ref 0.3–1.2)
Total Protein: 7.2 g/dL (ref 6.5–8.1)

## 2020-02-13 LAB — HCG, QUANTITATIVE, PREGNANCY: hCG, Beta Chain, Quant, S: <1 m[IU]/mL (ref <5)

## 2020-02-13 LAB — LIPASE, BLOOD: Lipase: 68 U/L — ABNORMAL HIGH (ref 11–51)

## 2020-02-13 MED ORDER — SODIUM CHLORIDE 0.9 % IV SOLN: INTRAVENOUS | Status: DC | PRN

## 2020-02-13 MED ORDER — ONDANSETRON 4 MG PO TBDP: 4.0000 mg | ORAL_TABLET | Freq: Three times a day (TID) | ORAL | 0 refills | Status: AC | PRN

## 2020-02-13 MED ORDER — METHYLPREDNISOLONE SODIUM SUCC 125 MG IJ SOLR: 125.0000 mg | Freq: Once | INTRAMUSCULAR | Status: DC | PRN

## 2020-02-13 MED ORDER — DIPHENHYDRAMINE HCL 50 MG/ML IJ SOLN: 50.0000 mg | Freq: Once | INTRAMUSCULAR | Status: DC | PRN

## 2020-02-13 MED ORDER — SODIUM CHLORIDE 0.9 % IV SOLN
1200.0000 mg | Freq: Once | INTRAVENOUS | Status: AC
  Administered 2020-02-13: 1200 mg via INTRAVENOUS
  Filled 2020-02-13: qty 10

## 2020-02-13 MED ORDER — ALBUTEROL SULFATE HFA 108 (90 BASE) MCG/ACT IN AERS: 2.0000 | INHALATION_SPRAY | Freq: Once | RESPIRATORY_TRACT | Status: DC | PRN

## 2020-02-13 MED ORDER — SODIUM CHLORIDE 0.9 % IV BOLUS
1000.0000 mL | Freq: Once | INTRAVENOUS | Status: AC
  Administered 2020-02-13: 1000 mL via INTRAVENOUS

## 2020-02-13 MED ORDER — ONDANSETRON HCL 4 MG/2ML IJ SOLN
4.0000 mg | Freq: Once | INTRAMUSCULAR | Status: DC
  Filled 2020-02-13: qty 2

## 2020-02-13 MED ORDER — EPINEPHRINE 0.3 MG/0.3ML IJ SOAJ: 0.3000 mg | Freq: Once | INTRAMUSCULAR | Status: DC | PRN

## 2020-02-13 MED ORDER — ALBUTEROL SULFATE HFA 108 (90 BASE) MCG/ACT IN AERS: 1.0000 | INHALATION_SPRAY | Freq: Four times a day (QID) | RESPIRATORY_TRACT | 0 refills | Status: AC | PRN

## 2020-02-13 MED ORDER — ACETAMINOPHEN 500 MG PO TABS
1000.0000 mg | ORAL_TABLET | Freq: Once | ORAL | Status: AC
  Administered 2020-02-13: 13:00:00 1000 mg via ORAL
  Filled 2020-02-13: qty 2

## 2020-02-13 MED ORDER — FAMOTIDINE IN NACL 20-0.9 MG/50ML-% IV SOLN: 20.0000 mg | Freq: Once | INTRAVENOUS | Status: DC | PRN

## 2020-02-13 NOTE — ED Notes (Signed)
Pt ambulated around room, satting between 93-95%.

## 2020-02-13 NOTE — ED Triage Notes (Signed)
Pt arrived via walk in, c/o SOB n/v, cough, chills,fevers, and chills. Dx with COVID 8/27, worsening sx since.

## 2020-02-13 NOTE — Discharge Instructions (Signed)
You were seen in the emergency department today with COVID-19.  We have administered the monoclonal antibodies.  I have called in 2 prescriptions to the pharmacy to help with symptoms.  If you develop worsening shortness of breath or chest pain you should return to the emergency department immediately for evaluation.  Please remain in quarantine and follow closely with your primary care doctor.  I have listed on this form to call for an appointment if you do not already have one.

## 2020-02-13 NOTE — ED Provider Notes (Signed)
4:12 PM Lab results available via fax, as there is computer downtime.  Chemistry panel, unremarkable, patient is not pregnant, CBC unremarkable.  Patient appropriate for discharge following antibody fusion.   Gerhard Munch, MD 02/13/20 [phone]

## 2020-02-13 NOTE — ED Provider Notes (Signed)
Emergency Department Provider Note   I have reviewed the triage vital signs and the nursing notes.   HISTORY  Chief Complaint COVID+ and Shortness of Breath   HPI Melanie Moore is a 29 y.o. female with PMH reviewed below presents to the emergency department with shortness of breath, cough, fever, chills, and nausea.  Patient has been feeling Covid symptoms since 08/23 with diagnosis made on 08/27 at outpatient UC.  She has had worsening symptoms including shortness of breath over the past several days.  She denies any chest or abdominal pain.  She has pain with coughing only which is center of the chest. Mild diarrhea with nausea and vomiting.   Past Medical History:  Diagnosis Date  . 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 Vaginal Pap smear, abnormal     Patient Active Problem List   Diagnosis Date Noted  . Post term pregnancy at [redacted] weeks gestation 09/29/2016  . Supervision of low-risk pregnancy 06/07/2016  . Family history of polycystic kidney disease 06/07/2016  . Anxiety in pregnancy, antepartum, second trimester 06/07/2016  . Low lying placenta nos or without hemorrhage, second trimester 05/24/2016  . Previous cesarean delivery, delivered 03/18/2015    Past Surgical History:  Procedure Laterality Date  . CESAREAN SECTION N/A 03/18/2015   Procedure: CESAREAN SECTION;  Surgeon: Catalina Antigua, MD;  Location: [facility];  Service: Obstetrics;  Laterality: N/A;  . EYE SURGERY  2008   sty removed both eyes    Allergies Patient has no known allergies.  Family History  Problem Relation Age of Onset  . Diabetes Father   . Hyperlipidemia Father   . Depression Mother   . Fibromyalgia Mother   . Diabetes Maternal Grandfather   . Diabetes Paternal Grandmother   . Diabetes Paternal Grandfather     Social History Social History   Tobacco Use  . Smoking status: Never Smoker  .  Smokeless tobacco: Never Used  Substance Use Topics  . Alcohol use: No    Comment: socially before pregnancy  . Drug use: No    Review of Systems  Constitutional: Positive fever/chills Eyes: No visual changes. ENT: No sore throat. Cardiovascular: Denies chest pain. Respiratory: Positive shortness of breath and cough.  Gastrointestinal: No abdominal pain. Positive nausea, vomiting, and diarrhea.  No constipation. Genitourinary: Negative for dysuria. Musculoskeletal: Negative for back pain. Positive body aches.  Skin: Negative for rash. Neurological: Negative for focal weakness or numbness. Positive HA.   10-point ROS otherwise negative.  ____________________________________________   PHYSICAL EXAM:  VITAL SIGNS: ED Triage Vitals  Enc Vitals Group     BP 02/13/20 1128 99/60     Pulse Rate 02/13/20 1128 94     Resp 02/13/20 1128 16     Temp 02/13/20 1128 100.1 F (37.8 C)     Temp Source 02/13/20 1128 Oral     SpO2 02/13/20 1128 92 %   Constitutional: Alert and oriented. Well appearing and in no acute distress. Eyes: Conjunctivae are normal. Head: Atraumatic. Nose: No congestion/rhinnorhea. Mouth/Throat: Mucous membranes are moist.  Neck: No stridor. Cardiovascular: Normal rate, regular rhythm. Good peripheral circulation. Grossly normal heart sounds.   Respiratory: Slight increased respiratory effort.  No retractions. Lungs CTAB. Gastrointestinal: Soft and nontender. No distention.  Musculoskeletal: No lower extremity tenderness nor edema. No gross deformities of extremities. Neurologic:  Normal speech and language. Skin:  Skin is warm, dry and  intact. No rash noted.   ____________________________________________   LABS (all labs ordered are listed, but only abnormal results are displayed)  Labs Reviewed  COMPREHENSIVE METABOLIC PANEL - Abnormal; Notable for the following components:      Result Value   Glucose, Bld 104 (*)    AST 123 (*)    ALT 181 (*)     All other components within normal limits  LIPASE, BLOOD - Abnormal; Notable for the following components:   Lipase 68 (*)    All other components within normal limits  CBC WITH DIFFERENTIAL/PLATELET  HCG, QUANTITATIVE, PREGNANCY   ____________________________________________  RADIOLOGY  CXR reviewed ____________________________________________   PROCEDURES  Procedure(s) performed:   Procedures  None ____________________________________________   INITIAL IMPRESSION / ASSESSMENT AND PLAN / ED COURSE  Pertinent labs & imaging results that were available during my care of the patient were reviewed by me and considered in my medical decision making (see chart for details).   Patient presents to the emergency department with COVID-19 symptoms worsening over the past several days.  Currently day 10 of symptoms.  She does have some dyspnea on my initial exam.  No hypoxemia.  Will ambulate on pulse ox and obtain chest x-ray.   Patient ambulated satting 93-95%. Some tachypnea remains but no acute distress. Discussed monoclonal ab therapy. This is the final day that the patient would be eligible. Discussed risk/benefit and patient will move forward.   Care transferred to Dr. Jeraldine Loots pending antibody therapy in the ED.   ____________________________________________  FINAL CLINICAL IMPRESSION(S) / ED DIAGNOSES  Final diagnoses:  COVID-19     MEDICATIONS GIVEN DURING THIS VISIT:  Medications  acetaminophen (TYLENOL) tablet 1,000 mg (1,000 mg Oral Given 02/13/20 1248)  sodium chloride 0.9 % bolus 1,000 mL (0 mLs Intravenous Stopped 02/13/20 1633)  casirivimab-imdevimab (REGEN-COV) 1,200 mg in sodium chloride 0.9 % 110 mL IVPB (0 mg Intravenous Stopped 02/13/20 1534)     NEW OUTPATIENT MEDICATIONS STARTED DURING THIS VISIT:  Discharge Medication List as of 02/13/2020  4:12 PM    START taking these medications   Details  albuterol (VENTOLIN HFA) 108 (90 Base) MCG/ACT inhaler  Inhale 1-2 puffs into the lungs every 6 (six) hours as needed for wheezing or shortness of breath., Starting Thu 02/13/2020, Normal    ondansetron (ZOFRAN ODT) 4 MG disintegrating tablet Take 1 tablet (4 mg total) by mouth every 8 (eight) hours as needed for nausea or vomiting., Starting Thu 02/13/2020, Normal        Note:  This document was prepared using Dragon voice recognition software and may include unintentional dictation errors.  Alona Bene, MD, [facility]    Jaydee Ingman, Arlyss Repress, MD 02/17/20 1224
